# Patient Record
Sex: Female | Born: 1955 | Race: White | Hispanic: No | Marital: Single | Smoking: Current every day smoker
Health system: Southern US, Community
[De-identification: ages and names within clinical notes are randomized; demographics above are authoritative.]

## PROBLEM LIST (undated history)

## (undated) DIAGNOSIS — E890 Postprocedural hypothyroidism: Secondary | ICD-10-CM

## (undated) DIAGNOSIS — J309 Allergic rhinitis, unspecified: Secondary | ICD-10-CM

## (undated) DIAGNOSIS — F17200 Nicotine dependence, unspecified, uncomplicated: Secondary | ICD-10-CM

## (undated) DIAGNOSIS — I517 Cardiomegaly: Secondary | ICD-10-CM

## (undated) DIAGNOSIS — R739 Hyperglycemia, unspecified: Secondary | ICD-10-CM

## (undated) HISTORY — DX: Cardiomegaly: I51.7

## (undated) HISTORY — DX: Postprocedural hypothyroidism: E89.0

## (undated) HISTORY — DX: Nicotine dependence, unspecified, uncomplicated: F17.200

## (undated) HISTORY — DX: Hyperglycemia, unspecified: R73.9

## (undated) HISTORY — DX: Allergic rhinitis, unspecified: J30.9

---

## 1998-01-02 ENCOUNTER — Other Ambulatory Visit: Admission: RE | Admit: 1998-01-02 | Discharge: 1998-01-02 | Payer: Self-pay | Admitting: Gynecology

## 1999-01-14 ENCOUNTER — Other Ambulatory Visit: Admission: RE | Admit: 1999-01-14 | Discharge: 1999-01-14 | Payer: Self-pay | Admitting: Gynecology

## 1999-12-25 ENCOUNTER — Other Ambulatory Visit: Admission: RE | Admit: 1999-12-25 | Discharge: 1999-12-25 | Payer: Self-pay | Admitting: Gynecology

## 2000-01-14 ENCOUNTER — Encounter: Payer: Self-pay | Admitting: Gynecology

## 2000-01-14 ENCOUNTER — Encounter: Admission: RE | Admit: 2000-01-14 | Discharge: 2000-01-14 | Payer: Self-pay | Admitting: Gynecology

## 2001-01-14 ENCOUNTER — Encounter: Payer: Self-pay | Admitting: Gynecology

## 2001-01-14 ENCOUNTER — Encounter: Admission: RE | Admit: 2001-01-14 | Discharge: 2001-01-14 | Payer: Self-pay | Admitting: Gynecology

## 2001-01-24 ENCOUNTER — Other Ambulatory Visit: Admission: RE | Admit: 2001-01-24 | Discharge: 2001-01-24 | Payer: Self-pay | Admitting: Gynecology

## 2001-12-05 ENCOUNTER — Encounter: Payer: Self-pay | Admitting: Endocrinology

## 2001-12-05 ENCOUNTER — Ambulatory Visit (HOSPITAL_COMMUNITY): Admission: RE | Admit: 2001-12-05 | Discharge: 2001-12-05 | Payer: Self-pay | Admitting: Endocrinology

## 2002-07-22 HISTORY — PX: OTHER SURGICAL HISTORY: SHX169

## 2003-04-23 ENCOUNTER — Other Ambulatory Visit: Admission: RE | Admit: 2003-04-23 | Discharge: 2003-04-23 | Payer: Self-pay | Admitting: Gynecology

## 2003-04-26 ENCOUNTER — Encounter: Admission: RE | Admit: 2003-04-26 | Discharge: 2003-04-26 | Payer: Self-pay | Admitting: Gynecology

## 2003-05-29 ENCOUNTER — Ambulatory Visit (HOSPITAL_BASED_OUTPATIENT_CLINIC_OR_DEPARTMENT_OTHER): Admission: RE | Admit: 2003-05-29 | Discharge: 2003-05-29 | Payer: Self-pay | Admitting: Gynecology

## 2003-05-29 ENCOUNTER — Encounter (INDEPENDENT_AMBULATORY_CARE_PROVIDER_SITE_OTHER): Payer: Self-pay

## 2003-05-29 ENCOUNTER — Ambulatory Visit (HOSPITAL_COMMUNITY): Admission: RE | Admit: 2003-05-29 | Discharge: 2003-05-29 | Payer: Self-pay | Admitting: Gynecology

## 2003-10-26 HISTORY — PX: DOPPLER ECHOCARDIOGRAPHY: SHX263

## 2004-05-08 ENCOUNTER — Other Ambulatory Visit: Admission: RE | Admit: 2004-05-08 | Discharge: 2004-05-08 | Payer: Self-pay | Admitting: Gynecology

## 2004-06-13 ENCOUNTER — Encounter: Admission: RE | Admit: 2004-06-13 | Discharge: 2004-06-13 | Payer: Self-pay | Admitting: Gynecology

## 2004-11-05 ENCOUNTER — Ambulatory Visit: Payer: Self-pay | Admitting: Endocrinology

## 2004-11-11 ENCOUNTER — Ambulatory Visit: Payer: Self-pay | Admitting: Endocrinology

## 2005-01-07 ENCOUNTER — Ambulatory Visit: Payer: Self-pay | Admitting: Endocrinology

## 2005-10-09 ENCOUNTER — Ambulatory Visit: Payer: Self-pay | Admitting: Endocrinology

## 2005-10-09 HISTORY — PX: ELECTROCARDIOGRAM: SHX264

## 2005-10-28 ENCOUNTER — Encounter: Admission: RE | Admit: 2005-10-28 | Discharge: 2005-10-28 | Payer: Self-pay | Admitting: Gynecology

## 2005-10-28 ENCOUNTER — Other Ambulatory Visit: Admission: RE | Admit: 2005-10-28 | Discharge: 2005-10-28 | Payer: Self-pay | Admitting: Gynecology

## 2005-11-12 ENCOUNTER — Ambulatory Visit: Payer: Self-pay | Admitting: Endocrinology

## 2005-11-18 ENCOUNTER — Ambulatory Visit: Payer: Self-pay | Admitting: Endocrinology

## 2005-11-30 ENCOUNTER — Encounter: Admission: RE | Admit: 2005-11-30 | Discharge: 2005-11-30 | Payer: Self-pay | Admitting: Gynecology

## 2007-01-19 ENCOUNTER — Encounter: Admission: RE | Admit: 2007-01-19 | Discharge: 2007-01-19 | Payer: Self-pay | Admitting: Gynecology

## 2007-01-19 ENCOUNTER — Other Ambulatory Visit: Admission: RE | Admit: 2007-01-19 | Discharge: 2007-01-19 | Payer: Self-pay | Admitting: Gynecology

## 2007-01-21 ENCOUNTER — Encounter: Admission: RE | Admit: 2007-01-21 | Discharge: 2007-01-21 | Payer: Self-pay | Admitting: Gynecology

## 2007-01-28 ENCOUNTER — Ambulatory Visit: Payer: Self-pay | Admitting: Endocrinology

## 2007-01-28 LAB — CONVERTED CEMR LAB
ALT: 14 units/L (ref 0–35)
AST: 23 units/L (ref 0–37)
Albumin: 3.8 g/dL (ref 3.5–5.2)
Alkaline Phosphatase: 55 units/L (ref 39–117)
BUN: 15 mg/dL (ref 6–23)
Basophils Relative: 0.4 % (ref 0.0–1.0)
CO2: 30 meq/L (ref 19–32)
Chloride: 105 meq/L (ref 96–112)
Eosinophils Absolute: 0.4 10*3/uL (ref 0.0–0.6)
Eosinophils Relative: 4.2 % (ref 0.0–5.0)
GFR calc Af Amer: 98 mL/min
GFR calc non Af Amer: 81 mL/min
Glucose, Bld: 89 mg/dL (ref 70–99)
HCT: 37.6 % (ref 36.0–46.0)
HDL: 57.4 mg/dL (ref >39.0)
Hemoglobin: 13 g/dL (ref 12.0–15.0)
Lymphocytes Relative: 23.9 % (ref 12.0–46.0)
MCHC: 34.6 g/dL (ref 30.0–36.0)
Nitrite: NEGATIVE
Potassium: 5 meq/L (ref 3.5–5.1)
RDW: 13.1 % (ref 11.5–14.6)
Total Bilirubin: 0.7 mg/dL (ref 0.3–1.2)
Total CHOL/HDL Ratio: 2.5
Triglycerides: 34 mg/dL (ref 0–149)

## 2007-02-02 ENCOUNTER — Encounter: Payer: Self-pay | Admitting: Endocrinology

## 2007-02-02 DIAGNOSIS — J309 Allergic rhinitis, unspecified: Secondary | ICD-10-CM

## 2007-02-02 HISTORY — DX: Allergic rhinitis, unspecified: J30.9

## 2007-02-03 ENCOUNTER — Ambulatory Visit: Payer: Self-pay | Admitting: Endocrinology

## 2007-02-25 ENCOUNTER — Encounter: Payer: Self-pay | Admitting: Endocrinology

## 2008-01-25 ENCOUNTER — Encounter: Admission: RE | Admit: 2008-01-25 | Discharge: 2008-01-25 | Payer: Self-pay | Admitting: Gynecology

## 2008-02-24 ENCOUNTER — Ambulatory Visit: Payer: Self-pay | Admitting: Endocrinology

## 2008-02-26 LAB — CONVERTED CEMR LAB
AST: 21 units/L (ref 0–37)
Albumin: 3.7 g/dL (ref 3.5–5.2)
BUN: 18 mg/dL (ref 6–23)
Basophils Absolute: 0 10*3/uL (ref 0.0–0.1)
Bilirubin Urine: NEGATIVE
CO2: 27 meq/L (ref 19–32)
Eosinophils Absolute: 0.4 10*3/uL (ref 0.0–0.7)
GFR calc Af Amer: 114 mL/min
Leukocytes, UA: NEGATIVE
Monocytes Absolute: 0.9 10*3/uL (ref 0.1–1.0)
Neutro Abs: 4.5 10*3/uL (ref 1.4–7.7)
Nitrite: NEGATIVE
Platelets: 228 10*3/uL (ref 150–400)
Potassium: 4.6 meq/L (ref 3.5–5.1)
RBC: 4.03 M/uL (ref 3.87–5.11)
RDW: 12.8 % (ref 11.5–14.6)
Sodium: 142 meq/L (ref 135–145)
Specific Gravity, Urine: <=1.005 (ref 1.000–1.03)
Total Bilirubin: 1.1 mg/dL (ref 0.3–1.2)
Total CHOL/HDL Ratio: 2.9
Total Protein, Urine: NEGATIVE mg/dL
Total Protein: 6.9 g/dL (ref 6.0–8.3)
Triglycerides: 47 mg/dL (ref 0–149)
pH: 6 (ref 5.0–8.0)

## 2008-03-01 ENCOUNTER — Ambulatory Visit: Payer: Self-pay | Admitting: Endocrinology

## 2008-03-01 DIAGNOSIS — E890 Postprocedural hypothyroidism: Secondary | ICD-10-CM

## 2008-03-01 DIAGNOSIS — F17200 Nicotine dependence, unspecified, uncomplicated: Secondary | ICD-10-CM | POA: Insufficient documentation

## 2008-03-01 DIAGNOSIS — I517 Cardiomegaly: Secondary | ICD-10-CM

## 2008-03-01 HISTORY — DX: Nicotine dependence, unspecified, uncomplicated: F17.200

## 2008-03-01 HISTORY — DX: Postprocedural hypothyroidism: E89.0

## 2008-03-01 HISTORY — DX: Cardiomegaly: I51.7

## 2009-03-05 ENCOUNTER — Encounter: Admission: RE | Admit: 2009-03-05 | Discharge: 2009-03-05 | Payer: Self-pay | Admitting: Gynecology

## 2009-03-06 ENCOUNTER — Encounter: Payer: Self-pay | Admitting: Endocrinology

## 2009-03-13 ENCOUNTER — Ambulatory Visit: Payer: Self-pay | Admitting: Endocrinology

## 2009-03-13 LAB — CONVERTED CEMR LAB
ALT: 21 units/L (ref 0–35)
AST: 30 units/L (ref 0–37)
Albumin: 4.2 g/dL (ref 3.5–5.2)
Basophils Relative: 1.2 % (ref 0.0–3.0)
Bilirubin Urine: NEGATIVE
CO2: 30 meq/L (ref 19–32)
Cholesterol: 187 mg/dL (ref 0–200)
Creatinine, Ser: 0.8 mg/dL (ref 0.4–1.2)
Eosinophils Relative: 3.4 % (ref 0.0–5.0)
GFR calc non Af Amer: 80.45 mL/min (ref >60)
Glucose, Bld: 94 mg/dL (ref 70–99)
HDL: 60.7 mg/dL (ref >39.00)
LDL Cholesterol: 114 mg/dL — ABNORMAL HIGH (ref 0–99)
Leukocytes, UA: NEGATIVE
Lymphocytes Relative: 24.3 % (ref 12.0–46.0)
Lymphs Abs: 2.1 10*3/uL (ref 0.7–4.0)
Monocytes Relative: 9.9 % (ref 3.0–12.0)
Neutro Abs: 5.2 10*3/uL (ref 1.4–7.7)
Neutrophils Relative %: 61.2 % (ref 43.0–77.0)
RBC: 4.73 M/uL (ref 3.87–5.11)
RDW: 13.4 % (ref 11.5–14.6)
Sodium: 143 meq/L (ref 135–145)
Total CHOL/HDL Ratio: 3
Total Protein, Urine: NEGATIVE mg/dL
Urine Glucose: NEGATIVE mg/dL
WBC: 8.5 10*3/uL (ref 4.5–10.5)

## 2009-03-19 ENCOUNTER — Ambulatory Visit: Payer: Self-pay | Admitting: Endocrinology

## 2009-04-11 ENCOUNTER — Ambulatory Visit: Payer: Self-pay | Admitting: Gastroenterology

## 2009-04-22 ENCOUNTER — Encounter: Payer: Self-pay | Admitting: Gastroenterology

## 2009-04-22 ENCOUNTER — Ambulatory Visit: Payer: Self-pay | Admitting: Gastroenterology

## 2009-04-22 LAB — HM COLONOSCOPY

## 2009-04-24 ENCOUNTER — Encounter: Payer: Self-pay | Admitting: Gastroenterology

## 2009-05-10 ENCOUNTER — Ambulatory Visit: Payer: Self-pay | Admitting: Endocrinology

## 2009-05-11 LAB — CONVERTED CEMR LAB
LDL Cholesterol: 86 mg/dL (ref 0–99)
TSH: 1.15 microintl units/mL (ref 0.35–5.50)
Total CHOL/HDL Ratio: 3
VLDL: 15.2 mg/dL (ref 0.0–40.0)

## 2009-09-30 ENCOUNTER — Telehealth: Payer: Self-pay | Admitting: Endocrinology

## 2010-04-15 ENCOUNTER — Ambulatory Visit: Payer: Self-pay | Admitting: Endocrinology

## 2010-04-15 LAB — CONVERTED CEMR LAB
Albumin: 4.1 g/dL (ref 3.5–5.2)
Basophils Absolute: 0.3 10*3/uL — ABNORMAL HIGH (ref 0.0–0.1)
Basophils Relative: 3.6 % — ABNORMAL HIGH (ref 0.0–3.0)
CO2: 28 meq/L (ref 19–32)
Cholesterol: 163 mg/dL (ref 0–200)
Creatinine, Ser: 0.8 mg/dL (ref 0.4–1.2)
Eosinophils Relative: 3.8 % (ref 0.0–5.0)
Glucose, Bld: 98 mg/dL (ref 70–99)
HDL: 63.1 mg/dL (ref >39.00)
Hemoglobin, Urine: NEGATIVE
Lymphocytes Relative: 25.7 % (ref 12.0–46.0)
MCV: 91.8 fL (ref 78.0–100.0)
Monocytes Absolute: 0.6 10*3/uL (ref 0.1–1.0)
Monocytes Relative: 8 % (ref 3.0–12.0)
Neutro Abs: 4.4 10*3/uL (ref 1.4–7.7)
Neutrophils Relative %: 58.9 % (ref 43.0–77.0)
Nitrite: NEGATIVE
Potassium: 5.4 meq/L — ABNORMAL HIGH (ref 3.5–5.1)
RBC: 4.01 M/uL (ref 3.87–5.11)
Sodium: 139 meq/L (ref 135–145)
Specific Gravity, Urine: <=1.005 (ref 1.000–1.030)
WBC: 7.5 10*3/uL (ref 4.5–10.5)

## 2010-04-22 ENCOUNTER — Encounter: Admission: RE | Admit: 2010-04-22 | Discharge: 2010-04-22 | Payer: Self-pay | Admitting: Gynecology

## 2010-04-23 ENCOUNTER — Ambulatory Visit: Payer: Self-pay | Admitting: Endocrinology

## 2010-04-23 ENCOUNTER — Encounter: Payer: Self-pay | Admitting: Endocrinology

## 2010-07-29 NOTE — Assessment & Plan Note (Signed)
Summary: PHYSICAL---STC   Vital Signs:  Patient profile:   55 year old female Height:      67 inches Weight:      174 pounds (79.09 kg) BMI:     27.35 O2 Sat:      98 % on Room air Temp:     98.1 degrees F (36.72 degrees C) oral Pulse rate:   75 / minute BP sitting:   126 / 76  (left arm) Cuff size:   regular  Vitals Entered By: Brenton Grills MA (April 23, 2010 8:59 AM)  O2 Flow:  Room air CC: Physical/aj Is Patient Diabetic? No Comments Pt had mammogram and pap smear on 04/22/2010    CC:  Physical/aj.  History of Present Illness: here for regular wellness examination.  she's feeling pretty well in general, and seldom drinks etoh.   Current Medications (verified): 1)  Multivitamins   Tabs (Multiple Vitamin) .... Take 1 By Mouth Qd 2)  Calcium 600 1500 Mg  Tabs (Calcium Carbonate) .... T5ake 1 By Mouth Qd 3)  Lisinopril 10 Mg  Tabs (Lisinopril) .... Take 1 Po Qd 4)  Mevacor 40 Mg  Tabs (Lovastatin) .... Take 1 By Mouth Qd 5)  Levothyroxine Sodium 150 Mcg Tabs (Levothyroxine Sodium) .Marland Kitchen.. 1 Qd  Allergies (verified): No Known Drug Allergies  Past History:  Past Medical History: Last updated: 02/02/2007 Allergic rhinitis Hypothyroidism Hyperglycemia Smoker  Family History: Reviewed history from 03/19/2009 and no changes required. father had lung cancer.  Social History: Reviewed history from 03/19/2009 and no changes required. works Engineering geologist. single.  Review of Systems  The patient denies fever, weight loss, weight gain, vision loss, decreased hearing, chest pain, syncope, dyspnea on exertion, prolonged cough, headaches, abdominal pain, melena, hematochezia, severe indigestion/heartburn, hematuria, suspicious skin lesions, and depression.    Physical Exam  General:  normal appearance.   Head:  head: no deformity eyes: no periorbital swelling, no proptosis external nose and ears are normal mouth: no lesion seen Neck:  Supple without thyroid enlargement  or tenderness.  Breasts:  sees gyn Lungs:  Clear to auscultation bilaterally. Normal respiratory effort.  Heart:  Regular rate and rhythm without murmurs or gallops noted. Normal S1,S2.   Abdomen:  abdomen is soft, nontender.  no hepatosplenomegaly.   not distended.  no hernia  Rectal:  sees gyn  Genitalia:  sees gyn  Msk:  muscle bulk and strength are grossly normal.  no obvious joint swelling.  gait is normal and steady  Pulses:  dorsalis pedis intact bilat.  no carotid bruit  Extremities:  no deformity.  no ulcer on the feet.  feet are of normal color and temp.  no edema  Neurologic:  cn 2-12 grossly intact.   readily moves all 4's.   sensation is intact to touch on the feet  Skin:  normal texture and temp.  no rash.  not diaphoretic  Cervical Nodes:  No significant adenopathy.  Psych:  Alert and cooperative; normal mood and affect; normal attention span and concentration.     Impression & Recommendations:  Problem # 1:  ROUTINE GENERAL MEDICAL EXAM@HEALTH  CARE FACL (ICD-V70.0) we discussed welbutrin and chantix.  she chooses welbutrin  Medications Added to Medication List This Visit: 1)  Multivitamins Tabs (Multiple vitamin) .... Take 1 by mouth once daily 2)  Calcium 600 1500 Mg Tabs (Calcium carbonate) .... Take 1 by mouth once daily 3)  Lisinopril 10 Mg Tabs (Lisinopril) .... Take 1 po once daily 4)  Mevacor 40  Mg Tabs (Lovastatin) .... Take 1 by mouth once daily 5)  Levothyroxine Sodium 150 Mcg Tabs (Levothyroxine sodium) .Marland Kitchen.. 1 once daily 6)  Bupropion Hcl 150 Mg Xr12h-tab (Bupropion hcl) .Marland Kitchen.. 1 tab each am  Other Orders: Admin 1st Vaccine (52841) Flu Vaccine 53yrs + (32440) Est. Patient 40-64 years [phone])  Preventive Care Screening     gyn is dr Nicholas Lose, who does dexa and mammography   Patient Instructions: 1)  please consider these measures for your health:  minimize alcohol.  do not use tobacco products.  have a colonoscopy at least every 10 years from age  73.  keep firearms safely stored.  always use seat belts.  have working smoke alarms in your home.  see an eye doctor and dentist regularly.  never drive under the influence of alcohol or drugs (including prescription drugs).  those with fair skin should take precautions against the sun. 2)  please let me know what your wishes would be, if artificial life support measures should become necessary.  it is critically important to prevent falling down (keep floor areas well-lit, dry, and free of loose objects) 3)  take buproprion 150 mg each am.  call if you want to increase to 300 mg each am, to help you quit smoking. 4)  (update:  we discussed code status.  pt requests full code, but would not want to be started or maintained on artificial life-support measures if there was not a reasonable chance of recovery) Prescriptions: LEVOTHYROXINE SODIUM 150 MCG TABS (LEVOTHYROXINE SODIUM) 1 once daily  #30 x 11   Entered by:   Brenton Grills CMA (AAMA)   Authorized by:   Minus Breeding MD   Signed by:   Brenton Grills CMA (AAMA) on 04/23/2010   Method used:   [facility] to        [facility]  [address] [id number]       [address]       [place], [place]  [address]       Ph: [phone] or [phone]       Fax: [phone]   RxID:   [id number] MEVACOR 40 MG  TABS (LOVASTATIN) take 1 by mouth once daily  #30 x 11   Entered by:   Brenton Grills CMA (AAMA)   Authorized by:   Minus Breeding MD   Signed by:   Brenton Grills CMA (AAMA) on 04/23/2010   Method used:   [facility]        [facility]  [address] [id number] ([facility]       [address]       [place], [place]  [address]       Ph: [phone] or [phone]       Fax: [phone]   RxID:   [id number] LISINOPRIL 10 MG  TABS (LISINOPRIL) take 1 po once daily  #30 x 11   Entered by:   Brenton Grills CMA (AAMA)   Authorized by:   Minus Breeding MD   Signed by:   Brenton Grills CMA (AAMA) on  04/23/2010   Method used:   [facility]        [facility]  [address] [id number]       [address]       [place], [place]  [address]       Ph: [phone] or [phone]  Fax: [phone]   RxID:   [id number] BUPROPION HCL 150 MG XR12H-TAB (BUPROPION HCL) 1 tab each am  #30 x 11   Entered and Authorized by:   Minus Breeding MD   Signed by:   Minus Breeding MD on 04/23/2010   Method used:   [facility]        [facility]  [address] [id number]       [address]       [place], [place]  [address]       Ph: [phone] or [phone]       Fax: [phone]   RxID:   [id number]    Orders Added: 1)  Admin 1st Vaccine [90471] 2)  Flu Vaccine 50yrs + [66063] 3)  Est. Patient 40-64 years [01601]    Flu Vaccine Consent Questions     Do you have a history of severe allergic reactions to this vaccine? no    Any prior history of allergic reactions to egg and/or gelatin? no    Do you have a sensitivity to the preservative Thimersol? no    Do you have a past history of Guillan-Barre Syndrome? no    Do you currently have an acute febrile illness? no    Have you ever had a severe reaction to latex? no    Vaccine information given and explained to patient? yes    Are you currently pregnant? no    Lot Number:[id number]   Exp Date:12/27/2010   Site Given  Left Deltoid IM  Preventive Care Screening     gyn is dr lomax, who does dexa and mammography       .lbflu1

## 2010-07-29 NOTE — Progress Notes (Signed)
Summary: Levothyroxine  Phone Note From Pharmacy   Caller[facility]  [address] [id number] Reason for Call: Allergy Alert Details of Request: [facility] [phone] ID:  [id number] Summary of Call: The office rec'd a note from pharmacy that Levothyroxine requires prior authorization.  [facility] [employer] noted above is patient workers comp, I made them aware that they probably should not be dealing with this med and they stated that they would contact the pharmacy directly. No further action needed. Initial call taken by: Lucious Groves,  September 30, 2009 11:17 AM

## 2010-11-14 NOTE — Op Note (Signed)
NAME:  Victoria Landry, Victoria Landry                      ACCOUNT NO.:  [id number]   MEDICAL RECORD NO.:  [id number]                   PATIENT TYPE:  AMB   LOCATION:  [facility]                                 FACILITY:  [facility]   PHYSICIAN:  Gretta Cool, M.D.              DATE OF BIRTH:  July 01, 1955   DATE OF PROCEDURE:  05/29/2003  DATE OF DISCHARGE:                                 OPERATIVE REPORT   PREOPERATIVE DIAGNOSIS:  Endometrial polyp.   POSTOPERATIVE DIAGNOSIS:  Endometrial polyp.   PROCEDURE:  Hysteroscopy resection of endometrial polyp and total  endometrial resection for ablation.   SURGEON:  Gretta Cool, M.D.   ANESTHESIA:  MAC plus paracervical block.   DESCRIPTION OF PROCEDURE:  The patient had previously had laminaria  insertion in the office on May 28, 2003, the laminaria was removed, and  the cervix was already dilated sufficient to accommodate the resectoscope.  The resectoscope was then introduced and because of the small size of the  cavity and presence of the polyp there was difficult exchange of fluid.  Once a portion of the polyp was resected the instrument could be placed  further into the cavity to allow fluid exchange.  Cavity was small enough  that only inflow could occur and outflow was restricted.  Once the cavity  was adequately resected of polyp the cavity was sufficient in size to allow  return.  A small channel was made in the internal os to allow some return as  well.  Once the entire cavity was resected of polyp the entire cavity was  then resected of endometrium.  The VaporTrode was then used to  systematically treat the entire cavity to eliminate any viable islands of  endometrial tissue.  The cornual areas were treated by touch technique.  At  the end of the procedure the fluid deficit was less than 100 mL.  There were  no complications.  The bleeding was well controlled at reduced pressure.  At  this point the procedure was terminated without  complication and the patient  returned to the recovery room in excellent condition.                                               Gretta Cool, M.D.    CWL/MEDQ  D:  05/29/2003  T:  05/29/2003  Job:  [id number]   cc:   Gregary Signs A. Everardo All, M.D. [facility]

## 2011-03-30 LAB — HM PAP SMEAR: HM Pap smear: NORMAL

## 2011-04-20 ENCOUNTER — Other Ambulatory Visit: Payer: Self-pay | Admitting: Gynecology

## 2011-04-20 DIAGNOSIS — Z1231 Encounter for screening mammogram for malignant neoplasm of breast: Secondary | ICD-10-CM

## 2011-04-28 ENCOUNTER — Other Ambulatory Visit: Payer: Self-pay | Admitting: Gynecology

## 2011-05-04 ENCOUNTER — Ambulatory Visit: Payer: Self-pay

## 2011-05-13 ENCOUNTER — Ambulatory Visit
Admission: RE | Admit: 2011-05-13 | Discharge: 2011-05-13 | Disposition: A | Discharge status: Home / Self Care | Payer: BC Managed Care – PPO | Source: Ambulatory Visit | Attending: Gynecology | Admitting: Gynecology

## 2011-05-13 DIAGNOSIS — Z1231 Encounter for screening mammogram for malignant neoplasm of breast: Secondary | ICD-10-CM

## 2011-05-19 ENCOUNTER — Other Ambulatory Visit: Payer: Self-pay | Admitting: Endocrinology

## 2011-05-22 ENCOUNTER — Other Ambulatory Visit: Payer: Self-pay | Admitting: Gynecology

## 2011-05-22 DIAGNOSIS — R928 Other abnormal and inconclusive findings on diagnostic imaging of breast: Secondary | ICD-10-CM

## 2011-06-12 ENCOUNTER — Ambulatory Visit
Admission: RE | Admit: 2011-06-12 | Discharge: 2011-06-12 | Disposition: A | Discharge status: Home / Self Care | Payer: BC Managed Care – PPO | Source: Ambulatory Visit | Attending: Gynecology | Admitting: Gynecology

## 2011-06-12 DIAGNOSIS — R928 Other abnormal and inconclusive findings on diagnostic imaging of breast: Secondary | ICD-10-CM

## 2011-06-26 ENCOUNTER — Other Ambulatory Visit: Payer: Self-pay | Admitting: Endocrinology

## 2011-07-20 ENCOUNTER — Other Ambulatory Visit: Payer: Self-pay | Admitting: Endocrinology

## 2011-07-27 ENCOUNTER — Other Ambulatory Visit: Payer: Self-pay | Admitting: Endocrinology

## 2011-08-07 ENCOUNTER — Telehealth: Payer: Self-pay | Admitting: *Deleted

## 2011-08-07 DIAGNOSIS — Z0000 Encounter for general adult medical examination without abnormal findings: Secondary | ICD-10-CM

## 2011-08-07 NOTE — Telephone Encounter (Signed)
Labs placed into Epic for upcoming CPX appointment.  

## 2011-08-07 NOTE — Telephone Encounter (Signed)
Message copied by Carin Primrose on Fri Aug 07, 2011  8:38 AM ------      Message from: Earl Lagos      Created: Tue Aug 04, 2011 11:56 AM      Regarding: labs       Please schedule labs for cpx 08/13/11.

## 2011-08-10 ENCOUNTER — Other Ambulatory Visit (INDEPENDENT_AMBULATORY_CARE_PROVIDER_SITE_OTHER): Payer: BC Managed Care – PPO

## 2011-08-10 DIAGNOSIS — Z0000 Encounter for general adult medical examination without abnormal findings: Secondary | ICD-10-CM

## 2011-08-10 LAB — CBC WITH DIFFERENTIAL/PLATELET
Basophils Relative: 0.6 % (ref 0.0–3.0)
Eosinophils Relative: 3.7 % (ref 0.0–5.0)
Lymphocytes Relative: 26.5 % (ref 12.0–46.0)
Monocytes Relative: 10.8 % (ref 3.0–12.0)
Neutrophils Relative %: 58.4 % (ref 43.0–77.0)
Platelets: 239 10*3/uL (ref 150.0–400.0)
RBC: 4.29 Mil/uL (ref 3.87–5.11)
WBC: 7.4 10*3/uL (ref 4.5–10.5)

## 2011-08-10 LAB — BASIC METABOLIC PANEL
CO2: 28 mEq/L (ref 19–32)
Chloride: 103 mEq/L (ref 96–112)
GFR: 67.82 mL/min (ref >60.00)
Glucose, Bld: 93 mg/dL (ref 70–99)
Potassium: 5.2 mEq/L — ABNORMAL HIGH (ref 3.5–5.1)
Sodium: 137 mEq/L (ref 135–145)

## 2011-08-10 LAB — URINALYSIS, ROUTINE W REFLEX MICROSCOPIC
Bilirubin Urine: NEGATIVE
Leukocytes, UA: NEGATIVE
Nitrite: NEGATIVE
pH: 7 (ref 5.0–8.0)

## 2011-08-10 LAB — HEPATIC FUNCTION PANEL
AST: 25 U/L (ref 0–37)
Albumin: 4.1 g/dL (ref 3.5–5.2)

## 2011-08-10 LAB — LIPID PANEL
Total CHOL/HDL Ratio: 3
Triglycerides: 66 mg/dL (ref 0.0–149.0)

## 2011-08-10 LAB — TSH: TSH: 7.54 u[IU]/mL — ABNORMAL HIGH (ref 0.35–5.50)

## 2011-08-13 ENCOUNTER — Ambulatory Visit (INDEPENDENT_AMBULATORY_CARE_PROVIDER_SITE_OTHER): Payer: BC Managed Care – PPO | Admitting: Endocrinology

## 2011-08-13 ENCOUNTER — Encounter: Payer: Self-pay | Admitting: Endocrinology

## 2011-08-13 VITALS — BP 128/82 | HR 90 | Temp 97.0°F | Ht 66.0 in | Wt 182.6 lb

## 2011-08-13 DIAGNOSIS — Z0000 Encounter for general adult medical examination without abnormal findings: Secondary | ICD-10-CM

## 2011-08-13 DIAGNOSIS — E890 Postprocedural hypothyroidism: Secondary | ICD-10-CM

## 2011-08-13 MED ORDER — VARENICLINE TARTRATE 0.5 MG PO TABS: 0.5000 mg | ORAL_TABLET | Freq: Two times a day (BID) | ORAL | Status: AC

## 2011-08-13 MED ORDER — LEVOTHYROXINE SODIUM 175 MCG PO TABS: 175.0000 ug | ORAL_TABLET | Freq: Every day | ORAL | Status: DC

## 2011-08-13 MED ORDER — LISINOPRIL-HYDROCHLOROTHIAZIDE 10-12.5 MG PO TABS: ORAL_TABLET | ORAL | Status: DC

## 2011-08-13 NOTE — Patient Instructions (Addendum)
Increase levothyroxine to 175 mcg/day Change lisinopril to lisinopril/hctz, 10/12.5, 1/2 pill daily. i have sent a prescription to your pharmacy, for "chantix" (quit-smoking medication) Put "miconazole," or clotrimazole" cream on your feet 2x a day. please consider these measures for your health:  minimize alcohol.  do not use tobacco products.  have a colonoscopy at least every 10 years from age 54.  Women should have an annual mammogram from age 12.  keep firearms safely stored.  always use seat belts.  have working smoke alarms in your home.  see an eye doctor and dentist regularly.  never drive under the influence of alcohol or drugs (including prescription drugs).  those with fair skin should take precautions against the sun. Please return in 1 year.

## 2011-08-13 NOTE — Progress Notes (Signed)
Subjective:    Patient ID: Victoria Landry, female    DOB: [birth year], 56 y.o.   MRN: [id number]  HPI here for regular wellness examination.  He's feeling pretty well in general, and says chronic med probs are stable, except as noted below.  She smokes 1/2 ppd, and does not take the wellbutrin. Gyn is dr lomax. Past Medical History  Diagnosis Date  . ALLERGIC RHINITIS 02/02/2007  . HYPOTHYROIDISM, POST-RADIATION 03/01/2008  . SMOKER 03/01/2008  . VENTRICULAR HYPERTROPHY, LEFT 03/01/2008  . Hyperglycemia     Past Surgical History  Procedure Date  . Doppler echocardiography 10/26/2003  . Electrocardiogram 10/09/2005  . I-131 therapy 07/22/2002    History   Social History  . Marital Status: Single    Spouse Name: N/A    Number of Children: N/A  . Years of Education: N/A   Occupational History  . Works Nurse, children[employer]    Social History Main Topics  . Smoking status: Current Everyday Smoker  . Smokeless tobacco: Not on file  . Alcohol Use: Not on file  . Drug Use: Not on file  . Sexually Active: Not on file   Other Topics Concern  . Not on file   Social History Narrative  . No narrative on file    Current Outpatient Prescriptions on File Prior to Visit  Medication Sig Dispense Refill  . buPROPion (WELLBUTRIN SR) 150 MG 12 hr tablet TAKE 1 TABLET BY MOUTH EVERY MORNING  30 tablet  0  . calcium carbonate (OS-CAL) 600 MG TABS Take 600 mg by mouth daily.      Marland Kitchen lovastatin (MEVACOR) 40 MG tablet TAKE 1 TABLET BY MOUTH EVERY DAY  30 tablet  0  . Multiple Vitamin (MULTIVITAMIN) tablet Take 1 tablet by mouth daily.        No Known Allergies  Family History  Problem Relation Age of Onset  . Cancer Father     Lung Cancer    BP 128/82  Pulse 90  Temp(Src) 97 F (36.1 C) (Oral)  Ht 5\' 6"  (1.676 m)  Wt 182 lb 9.6 oz (82.827 kg)  BMI 29.47 kg/m2  SpO2 98%    Review of Systems  Constitutional: Negative for fever.  HENT: Negative for hearing loss.   Eyes: Negative for visual  disturbance.  Respiratory: Negative for shortness of breath.   Cardiovascular: Negative for chest pain.  Gastrointestinal: Negative for anal bleeding.  Genitourinary: Negative for hematuria.  Musculoskeletal: Negative for back pain.  Skin: Negative for rash.  Neurological: Negative for syncope.  Hematological: Does not bruise/bleed easily.  Psychiatric/Behavioral: Negative for dysphoric mood.       Objective:   Physical Exam VS: see vs page GEN: no distress HEAD: head: no deformity eyes: no periorbital swelling, no proptosis external nose and ears are normal mouth: no lesion seen NECK: supple, thyroid is not enlarged CHEST WALL: no deformity LUNGS:  Clear to auscultation BREASTS:  sees gyn CV: reg rate and rhythm, no murmur ABD: abdomen is soft, nontender.  no hepatosplenomegaly.  not distended.  no hernia GENITALIA/RECTAL: sees gyn MUSCULOSKELETAL: muscle bulk and strength are grossly normal.  no obvious joint swelling.  gait is normal and steady EXTEMITIES: no deformity.  no ulcer on the feet.  feet are of normal color and temp.  no edema PULSES: dorsalis pedis intact bilat.  no carotid bruit NEURO:  cn 2-12 grossly intact.   readily moves all 4's.  sensation is intact to touch on the feet SKIN:  Normal  texture and temperature.  No rash or suspicious lesion is visible.   NODES:  None palpable at the neck PSYCH: alert, oriented x3.  Does not appear anxious nor depressed.        Assessment & Plan:  Wellness visit today, with problems stable, except as noted.    SEPARATE EVALUATION FOLLOWS--EACH PROBLEM HERE IS NEW, NOT RESPONDING TO TREATMENT, OR POSES SIGNIFICANT RISK TO THE PATIENT'S HEALTH: HISTORY OF THE PRESENT ILLNESS: Pt states few mos of moderate rash on the feet, and assoc itching PAST MEDICAL HISTORY reviewed and up to date today REVIEW OF SYSTEMS: Denies numbness adn weight change PHYSICAL EXAMINATION: VITAL SIGNS:  See vs page GENERAL: no  distress Pulses: dorsalis pedis intact bilat.   Feet: no deformity.  no ulcer on the feet.  feet are of normal color and temp.  no edema.  On the soles of the feet, there is mild to moderate tinea pedis Neuro: sensation is intact to touch on the feet LAB/XRAY RESULTS: Lab Results  Component Value Date   WBC 7.4 08/10/2011   HGB 13.9 08/10/2011   HCT 39.4 08/10/2011   PLT 239.0 08/10/2011   GLUCOSE 93 08/10/2011   CHOL 168 08/10/2011   TRIG 66.0 08/10/2011   HDL 65.80 08/10/2011   LDLCALC 89 08/10/2011   ALT 16 08/10/2011   AST 25 08/10/2011   NA 137 08/10/2011   K 5.2* 08/10/2011   CL 103 08/10/2011   CREATININE 0.9 08/10/2011   BUN 18 08/10/2011   CO2 28 08/10/2011   TSH 7.54* 08/10/2011   IMPRESSION: Mild hyperkalemia Hypothyroidism, needs increased rx Tinea pedis, new PLAN: See instruction page

## 2011-09-21 ENCOUNTER — Other Ambulatory Visit: Payer: Self-pay | Admitting: Endocrinology

## 2011-09-26 ENCOUNTER — Other Ambulatory Visit: Payer: Self-pay | Admitting: Endocrinology

## 2012-08-01 ENCOUNTER — Other Ambulatory Visit: Payer: Self-pay | Admitting: Gynecology

## 2012-08-01 DIAGNOSIS — Z1231 Encounter for screening mammogram for malignant neoplasm of breast: Secondary | ICD-10-CM

## 2012-08-09 ENCOUNTER — Ambulatory Visit: Payer: BC Managed Care – PPO

## 2012-08-24 ENCOUNTER — Other Ambulatory Visit: Payer: Self-pay | Admitting: Gynecology

## 2012-09-07 ENCOUNTER — Ambulatory Visit
Admission: RE | Admit: 2012-09-07 | Discharge: 2012-09-07 | Disposition: A | Discharge status: Home / Self Care | Payer: BC Managed Care – PPO | Source: Ambulatory Visit | Attending: Gynecology | Admitting: Gynecology

## 2012-09-07 DIAGNOSIS — Z1231 Encounter for screening mammogram for malignant neoplasm of breast: Secondary | ICD-10-CM

## 2013-09-20 ENCOUNTER — Other Ambulatory Visit: Payer: Self-pay

## 2013-09-20 DIAGNOSIS — Z1231 Encounter for screening mammogram for malignant neoplasm of breast: Secondary | ICD-10-CM

## 2013-10-02 ENCOUNTER — Ambulatory Visit
Admission: RE | Admit: 2013-10-02 | Discharge: 2013-10-02 | Disposition: A | Discharge status: Home / Self Care | Payer: BC Managed Care – PPO | Source: Ambulatory Visit

## 2013-10-02 DIAGNOSIS — Z1231 Encounter for screening mammogram for malignant neoplasm of breast: Secondary | ICD-10-CM

## 2013-10-11 ENCOUNTER — Encounter: Payer: Self-pay | Admitting: Endocrinology

## 2013-10-11 ENCOUNTER — Ambulatory Visit (INDEPENDENT_AMBULATORY_CARE_PROVIDER_SITE_OTHER): Payer: BC Managed Care – PPO | Admitting: Endocrinology

## 2013-10-11 ENCOUNTER — Ambulatory Visit
Admission: RE | Admit: 2013-10-11 | Discharge: 2013-10-11 | Disposition: A | Discharge status: Home / Self Care | Payer: BC Managed Care – PPO | Source: Ambulatory Visit | Attending: Endocrinology | Admitting: Endocrinology

## 2013-10-11 VITALS — BP 122/78 | HR 84 | Temp 97.9°F | Ht 66.0 in | Wt 195.0 lb

## 2013-10-11 DIAGNOSIS — R0689 Other abnormalities of breathing: Secondary | ICD-10-CM

## 2013-10-11 DIAGNOSIS — R9431 Abnormal electrocardiogram [ECG] [EKG]: Secondary | ICD-10-CM

## 2013-10-11 DIAGNOSIS — R0989 Other specified symptoms and signs involving the circulatory and respiratory systems: Secondary | ICD-10-CM

## 2013-10-11 DIAGNOSIS — R918 Other nonspecific abnormal finding of lung field: Secondary | ICD-10-CM

## 2013-10-11 DIAGNOSIS — Z0000 Encounter for general adult medical examination without abnormal findings: Secondary | ICD-10-CM

## 2013-10-11 DIAGNOSIS — F17200 Nicotine dependence, unspecified, uncomplicated: Secondary | ICD-10-CM

## 2013-10-11 LAB — CBC WITH DIFFERENTIAL/PLATELET
Basophils Absolute: 0.1 10*3/uL (ref 0.0–0.1)
Basophils Relative: 0.8 % (ref 0.0–3.0)
EOS PCT: 2.5 % (ref 0.0–5.0)
Eosinophils Absolute: 0.2 10*3/uL (ref 0.0–0.7)
HEMATOCRIT: 37.2 % (ref 36.0–46.0)
HEMOGLOBIN: 12.6 g/dL (ref 12.0–15.0)
LYMPHS ABS: 1.4 10*3/uL (ref 0.7–4.0)
Lymphocytes Relative: 19.3 % (ref 12.0–46.0)
MCHC: 33.8 g/dL (ref 30.0–36.0)
MCV: 94.2 fl (ref 78.0–100.0)
MONO ABS: 0.7 10*3/uL (ref 0.1–1.0)
MONOS PCT: 9.1 % (ref 3.0–12.0)
NEUTROS ABS: 5.1 10*3/uL (ref 1.4–7.7)
Neutrophils Relative %: 68.3 % (ref 43.0–77.0)
PLATELETS: 251 10*3/uL (ref 150.0–400.0)
RBC: 3.95 Mil/uL (ref 3.87–5.11)
RDW: 15.8 % — ABNORMAL HIGH (ref 11.5–14.6)
WBC: 7.4 10*3/uL (ref 4.5–10.5)

## 2013-10-11 LAB — HEPATIC FUNCTION PANEL
ALBUMIN: 4.3 g/dL (ref 3.5–5.2)
ALT: 160 U/L — ABNORMAL HIGH (ref 0–35)
AST: 187 U/L — AB (ref 0–37)
Alkaline Phosphatase: 51 U/L (ref 39–117)
BILIRUBIN TOTAL: 1 mg/dL (ref 0.3–1.2)
Bilirubin, Direct: 0 mg/dL (ref 0.0–0.3)
Total Protein: 8.1 g/dL (ref 6.0–8.3)

## 2013-10-11 LAB — URINALYSIS, ROUTINE W REFLEX MICROSCOPIC
Bilirubin Urine: NEGATIVE
Hgb urine dipstick: NEGATIVE
Ketones, ur: NEGATIVE
Leukocytes, UA: NEGATIVE
Nitrite: NEGATIVE
RBC / HPF: NONE SEEN (ref 0–?)
Specific Gravity, Urine: <=1.005 — AB (ref 1.000–1.030)
TOTAL PROTEIN, URINE-UPE24: NEGATIVE
URINE GLUCOSE: NEGATIVE
UROBILINOGEN UA: 0.2 (ref 0.0–1.0)
WBC UA: NONE SEEN (ref 0–?)
pH: 6.5 (ref 5.0–8.0)

## 2013-10-11 LAB — LIPID PANEL
CHOL/HDL RATIO: 4
Cholesterol: 312 mg/dL — ABNORMAL HIGH (ref 0–200)
HDL: 69.5 mg/dL (ref >39.00)
LDL Cholesterol: 226 mg/dL — ABNORMAL HIGH (ref 0–99)
TRIGLYCERIDES: 82 mg/dL (ref 0.0–149.0)
VLDL: 16.4 mg/dL (ref 0.0–40.0)

## 2013-10-11 LAB — BASIC METABOLIC PANEL
BUN: 25 mg/dL — ABNORMAL HIGH (ref 6–23)
CHLORIDE: 104 meq/L (ref 96–112)
CO2: 26 meq/L (ref 19–32)
Calcium: 9.6 mg/dL (ref 8.4–10.5)
Creatinine, Ser: 1 mg/dL (ref 0.4–1.2)
GFR: 63.29 mL/min (ref >60.00)
Glucose, Bld: 85 mg/dL (ref 70–99)
Potassium: 3.9 mEq/L (ref 3.5–5.1)
SODIUM: 138 meq/L (ref 135–145)

## 2013-10-11 LAB — TSH: TSH: 71.91 u[IU]/mL — ABNORMAL HIGH (ref 0.35–5.50)

## 2013-10-11 MED ORDER — LEVOTHYROXINE SODIUM 200 MCG PO TABS: 200.0000 ug | ORAL_TABLET | Freq: Every day | ORAL | Status: DC

## 2013-10-11 MED ORDER — TRIAMCINOLONE ACETONIDE 0.1 % EX CREA: 1.0000 "application " | TOPICAL_CREAM | Freq: Three times a day (TID) | CUTANEOUS | Status: DC

## 2013-10-11 NOTE — Patient Instructions (Addendum)
Let's check and "echo" (an easy and painless heart test).  you will receive a phone call, about a day and time for an appointment. i have sent a prescription to your pharmacy, for the rash.   please consider these measures for your health:  minimize alcohol.  do not use tobacco products.  have a colonoscopy at least every 10 years from age 56.  Women should have an annual mammogram from age 21.  keep firearms safely stored.  always use seat belts.  have working smoke alarms in your home.  see an eye doctor and dentist regularly.  never drive under the influence of alcohol or drugs (including prescription drugs).  those with fair skin should take precautions against the sun.   blood tests, and a chest-x-ray, are being requested for you today.  We'll contact you with results.      Smoking Cessation Quitting smoking is important to your health and has many advantages. However, it is not always easy to quit since nicotine is a very addictive drug. Often times, people try 3 times or more before being able to quit. This document explains the best ways for you to prepare to quit smoking. Quitting takes hard work and a lot of effort, but you can do it. ADVANTAGES OF QUITTING SMOKING  You will live longer, feel better, and live better.  Your body will feel the impact of quitting smoking almost immediately.  Within 20 minutes, blood pressure decreases. Your pulse returns to its normal level.  After 8 hours, carbon monoxide levels in the blood return to normal. Your oxygen level increases.  After 24 hours, the chance of having a heart attack starts to decrease. Your breath, hair, and body stop smelling like smoke.  After 48 hours, damaged nerve endings begin to recover. Your sense of taste and smell improve.  After 72 hours, the body is virtually free of nicotine. Your bronchial tubes relax and breathing becomes easier.  After 2 to 12 weeks, lungs can hold more air. Exercise becomes easier and  circulation improves.  The risk of having a heart attack, stroke, cancer, or lung disease is greatly reduced.  After 1 year, the risk of coronary heart disease is cut in half.  After 5 years, the risk of stroke falls to the same as a nonsmoker.  After 10 years, the risk of lung cancer is cut in half and the risk of other cancers decreases significantly.  After 15 years, the risk of coronary heart disease drops, usually to the level of a nonsmoker.  If you are pregnant, quitting smoking will improve your chances of having a healthy baby.  The people you live with, especially any children, will be healthier.  You will have extra money to spend on things other than cigarettes. QUESTIONS TO THINK ABOUT BEFORE ATTEMPTING TO QUIT You may want to talk about your answers with your caregiver.  Why do you want to quit?  If you tried to quit in the past, what helped and what did not?  What will be the most difficult situations for you after you quit? How will you plan to handle them?  Who can help you through the tough times? Your family? Friends? A caregiver?  What pleasures do you get from smoking? What ways can you still get pleasure if you quit? Here are some questions to ask your caregiver:  How can you help me to be successful at quitting?  What medicine do you think would be best for me  and how should I take it?  What should I do if I need more help?  What is smoking withdrawal like? How can I get information on withdrawal? GET READY  Set a quit date.  Change your environment by getting rid of all cigarettes, ashtrays, matches, and lighters in your home, car, or work. Do not let people smoke in your home.  Review your past attempts to quit. Think about what worked and what did not. GET SUPPORT AND ENCOURAGEMENT You have a better chance of being successful if you have help. You can get support in many ways.  Tell your family, friends, and co-workers that you are going to  quit and need their support. Ask them not to smoke around you.  Get individual, group, or telephone counseling and support. Programs are available at [facility] and health [facility]. Call your local health department for information about programs in your area.  Spiritual beliefs and practices may help some smokers quit.  Download a "quit meter" on your computer to keep track of quit statistics, such as how long you have gone without smoking, cigarettes not smoked, and money saved.  Get a self-help book about quitting smoking and staying off of tobacco. LEARN NEW SKILLS AND BEHAVIORS  Distract yourself from urges to smoke. Talk to someone, go for a walk, or occupy your time with a task.  Change your normal routine. Take a different route to work. Drink tea instead of coffee. Eat breakfast in a different place.  Reduce your stress. Take a hot bath, exercise, or read a book.  Plan something enjoyable to do every day. Reward yourself for not smoking.  Explore interactive web-based programs that specialize in helping you quit. GET MEDICINE AND USE IT CORRECTLY Medicines can help you stop smoking and decrease the urge to smoke. Combining medicine with the above behavioral methods and support can greatly increase your chances of successfully quitting smoking.  Nicotine replacement therapy helps deliver nicotine to your body without the negative effects and risks of smoking. Nicotine replacement therapy includes nicotine gum, lozenges, inhalers, nasal sprays, and skin patches. Some may be available over-the-counter and others require a prescription.  Antidepressant medicine helps people abstain from smoking, but how this works is unknown. This medicine is available by prescription.  Nicotinic receptor partial agonist medicine simulates the effect of nicotine in your brain. This medicine is available by prescription. Ask your caregiver for advice about which medicines to use and how to use  them based on your health history. Your caregiver will tell you what side effects to look out for if you choose to be on a medicine or therapy. Carefully read the information on the package. Do not use any other product containing nicotine while using a nicotine replacement product.  RELAPSE OR DIFFICULT SITUATIONS Most relapses occur within the first 3 months after quitting. Do not be discouraged if you start smoking again. Remember, most people try several times before finally quitting. You may have symptoms of withdrawal because your body is used to nicotine. You may crave cigarettes, be irritable, feel very hungry, cough often, get headaches, or have difficulty concentrating. The withdrawal symptoms are only temporary. They are strongest when you first quit, but they will go away within 10 14 days. To reduce the chances of relapse, try to:  Avoid drinking alcohol. Drinking lowers your chances of successfully quitting.  Reduce the amount of caffeine you consume. Once you quit smoking, the amount of caffeine in your body increases and  can give you symptoms, such as a rapid heartbeat, sweating, and anxiety.  Avoid smokers because they can make you want to smoke.  Do not let weight gain distract you. Many smokers will gain weight when they quit, usually less than 10 pounds. Eat a healthy diet and stay active. You can always lose the weight gained after you quit.  Find ways to improve your mood other than smoking. FOR MORE INFORMATION  www.smokefree.gov  Document Released: 06/09/2001 Document Revised: 12/15/2011 Document Reviewed: 09/24/2011 [facility] Patient Information 2014 [employer], [place].

## 2013-10-11 NOTE — Progress Notes (Signed)
Subjective:    Patient ID: Victoria Landry, female    DOB: 11/20/1955, 58 y.o.   MRN: [id number]  HPI Pt is here for regular wellness examination, and is feeling pretty well in general, and says chronic med probs are stable, except as noted below Past Medical History  Diagnosis Date  . ALLERGIC RHINITIS 02/02/2007  . HYPOTHYROIDISM, POST-RADIATION 03/01/2008  . SMOKER 03/01/2008  . VENTRICULAR HYPERTROPHY, LEFT 03/01/2008  . Hyperglycemia     Past Surgical History  Procedure Laterality Date  . Doppler echocardiography  10/26/2003  . Electrocardiogram  10/09/2005  . I-131 therapy  07/22/2002    History   Social History  . Marital Status: Single    Spouse Name: N/A    Number of Children: N/A  . Years of Education: N/A   Occupational History  . 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 Smoking status: Current Every Day Smoker  . Smokeless tobacco: Not on file  . Alcohol Use: Not on file  . Drug Use: Not on file  . Sexual Activity: Not on file   Other Topics Concern  . Not on file   Social History Narrative  . No narrative on file    Current Outpatient Prescriptions on File Prior to Visit  Medication Sig Dispense Refill  . buPROPion (WELLBUTRIN SR) 150 MG 12 hr tablet TAKE 1 TABLET BY MOUTH EVERY MORNING  30 tablet  10  . calcium carbonate (OS-CAL) 600 MG TABS Take 600 mg by mouth daily.      Marland Kitchen. lisinopril-hydrochlorothiazide (PRINZIDE,ZESTORETIC) 10-12.5 MG per tablet 1/2 tab daily  30 tablet  6  . lovastatin (MEVACOR) 40 MG tablet TAKE 1 TABLET BY MOUTH EVERY DAY  30 tablet  5  . Multiple Vitamin (MULTIVITAMIN) tablet Take 1 tablet by mouth daily.      Marland Kitchen. levothyroxine (SYNTHROID, LEVOTHROID) 175 MCG tablet Take 1 tablet (175 mcg total) by mouth daily.  30 tablet  11   No current facility-administered medications on file prior to visit.    No Known Allergies  Family History  Problem Relation Age of Onset  . Cancer Father     Lung Cancer    BP 122/78  Pulse 84   Temp(Src) 97.9 F (36.6 C) (Oral)  Ht 5\' 6"  (1.676 m)  Wt 195 lb (88.451 kg)  BMI 31.49 kg/m2  SpO2 94%  Review of Systems  Constitutional: Negative for fever.       She has weight-gain  HENT: Negative for hearing loss.   Eyes: Negative for visual disturbance.  Respiratory: Negative for shortness of breath.   Cardiovascular: Negative for chest pain.  Gastrointestinal: Negative for anal bleeding.  Endocrine: Negative for cold intolerance.  Genitourinary: Negative for hematuria.  Musculoskeletal: Negative for back pain.  Skin: Negative for rash.  Allergic/Immunologic: Negative for environmental allergies.  Neurological: Negative for syncope.  Hematological: Does not bruise/bleed easily.  Psychiatric/Behavioral: Negative for dysphoric mood.      Objective:   Physical Exam VS: see vs page GEN: no distress HEAD: head: no deformity eyes: no periorbital swelling, no proptosis external nose and ears are normal mouth: no lesion seen NECK: supple, thyroid is not enlarged CHEST WALL: no deformity BREASTS: sees gyn.   CV: reg rate and rhythm, no murmur ABD: abdomen is soft, nontender.  no hepatosplenomegaly.  not distended.  no hernia GENITALIA/RECTAL: sees gyn MUSCULOSKELETAL: muscle bulk and strength are grossly normal.  no obvious joint swelling.  gait is normal and  steady EXTEMITIES: no deformity.  no ulcer on the feet.  feet are of normal color and temp.  no edema PULSES: dorsalis pedis intact bilat.  no carotid bruit NEURO:  cn 2-12 grossly intact.   readily moves all 4's.  sensation is intact to touch on the feet SKIN:  Normal texture and temperature.  No rash or suspicious lesion is visible.   NODES:  None palpable at the neck PSYCH: alert, well-oriented.  Does not appear anxious nor depressed.   i reviewed electrocardiogram    Assessment & Plan:  Pt is here for regular wellness examination, and is feeling pretty well in general, and says chronic med probs are stable,  except as noted below we discussed code status.  pt requests full code, but would not want to be started or maintained on artificial life-support measures if there was not a reasonable chance of recovery     SEPARATE EVALUATION FOLLOWS--EACH PROBLEM HERE IS NEW, NOT RESPONDING TO TREATMENT, OR POSES SIGNIFICANT RISK TO THE PATIENT'S HEALTH: HISTORY OF THE PRESENT ILLNESS: Pt states a few weeks of moderate rash at the right leg, but no assoc ulcer PAST MEDICAL HISTORY reviewed and up to date today REVIEW OF SYSTEMS: The rash is very itchy.  She denies cough PHYSICAL EXAMINATION: VITAL SIGNS:  See vs page GENERAL: no distress Right ant tibial area: severe dyshidrosis.   LUNGS:  Clear to auscultation, except for rales at the bases. LAB/XRAY RESULTS: CXR result is noted i reviewed electrocardiogram Lab Results  Component Value Date   ALT 160* 10/11/2013   AST 187* 10/11/2013   ALKPHOS 51 10/11/2013   BILITOT 1.0 10/11/2013   Lab Results  Component Value Date   TSH 71.91* 10/11/2013  IMPRESSION: Abnormal ecg, new, uncertain etiology Rash, new, uncertain etiology Abnormal CXR, new, uncertain etiology Hypothyroidism: uncertain reason for poor control. Abnormal LFT, new, uncertain etiology PLAN: See instruction page

## 2013-10-20 ENCOUNTER — Other Ambulatory Visit: Payer: BC Managed Care – PPO

## 2013-10-22 ENCOUNTER — Telehealth: Payer: Self-pay | Admitting: Endocrinology

## 2013-10-22 DIAGNOSIS — R918 Other nonspecific abnormal finding of lung field: Secondary | ICD-10-CM

## 2013-10-22 NOTE — Telephone Encounter (Signed)
please call patient: Did you have your CT scan.  If not, why not?  i just want to make sure.

## 2013-10-23 NOTE — Telephone Encounter (Signed)
Unable to reach pt

## 2013-10-23 NOTE — Telephone Encounter (Signed)
Requested call back to discuss.  

## 2013-10-24 NOTE — Telephone Encounter (Signed)
Left voice mail for pt that she would be received a phone call about a time and date for an appointment.

## 2013-10-24 NOTE — Telephone Encounter (Signed)
please call patient: i ordered CT

## 2013-10-24 NOTE — Telephone Encounter (Signed)
Called patient and she states that she never received call about CT being set up. She states that she has not had it done.

## 2013-10-25 ENCOUNTER — Encounter: Payer: Self-pay | Admitting: Endocrinology

## 2013-10-25 ENCOUNTER — Ambulatory Visit (INDEPENDENT_AMBULATORY_CARE_PROVIDER_SITE_OTHER): Payer: BC Managed Care – PPO | Admitting: Endocrinology

## 2013-10-25 ENCOUNTER — Ambulatory Visit (INDEPENDENT_AMBULATORY_CARE_PROVIDER_SITE_OTHER)
Admission: RE | Admit: 2013-10-25 | Discharge: 2013-10-25 | Disposition: A | Discharge status: Home / Self Care | Payer: BC Managed Care – PPO | Source: Ambulatory Visit | Attending: Endocrinology | Admitting: Endocrinology

## 2013-10-25 VITALS — BP 128/98 | HR 102 | Temp 98.2°F | Ht 66.0 in | Wt 188.0 lb

## 2013-10-25 DIAGNOSIS — R7309 Other abnormal glucose: Secondary | ICD-10-CM

## 2013-10-25 DIAGNOSIS — F17200 Nicotine dependence, unspecified, uncomplicated: Secondary | ICD-10-CM

## 2013-10-25 DIAGNOSIS — R918 Other nonspecific abnormal finding of lung field: Secondary | ICD-10-CM

## 2013-10-25 MED ORDER — IOHEXOL 300 MG/ML  SOLN
80.0000 mL | Freq: Once | INTRAMUSCULAR | Status: AC | PRN
  Administered 2013-10-25: 80 mL via INTRAVENOUS

## 2013-10-25 NOTE — Progress Notes (Signed)
   Subjective:    Patient ID: Victoria Landry, female    DOB: 01/25/1956, 58 y.o.   MRN: [id number]  HPI The state of at least three ongoing medical problems is addressed today, with interval history of each noted here: Hypothyroidism: she feels much better back on the synthroid.   Abnormal cxr: she denies cough. Smoker: pt says she did not tolerate chantix, but wants to try other rx Past Medical History  Diagnosis Date  . ALLERGIC RHINITIS 02/02/2007  . HYPOTHYROIDISM, POST-RADIATION 03/01/2008  . SMOKER 03/01/2008  . VENTRICULAR HYPERTROPHY, LEFT 03/01/2008  . Hyperglycemia     Past Surgical History  Procedure Laterality Date  . Doppler echocardiography  10/26/2003  . Electrocardiogram  10/09/2005  . I-131 therapy  07/22/2002    History   Social History  . Marital Status: Single    Spouse Name: N/A    Number of Children: N/A  . Years of Education: N/A   Occupational History  . 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 Smoking status: Current Every Day Smoker  . Smokeless tobacco: Not on file  . Alcohol Use: Not on file  . Drug Use: Not on file  . Sexual Activity: Not on file   Other Topics Concern  . Not on file   Social History Narrative  . No narrative on file    Current Outpatient Prescriptions on File Prior to Visit  Medication Sig Dispense Refill  . buPROPion (WELLBUTRIN SR) 150 MG 12 hr tablet TAKE 1 TABLET BY MOUTH EVERY MORNING  30 tablet  10  . calcium carbonate (OS-CAL) 600 MG TABS Take 600 mg by mouth daily.      Marland Kitchen. levothyroxine (SYNTHROID, LEVOTHROID) 200 MCG tablet Take 1 tablet (200 mcg total) by mouth daily before breakfast.  30 tablet  2  . lisinopril-hydrochlorothiazide (PRINZIDE,ZESTORETIC) 10-12.5 MG per tablet 1/2 tab daily  30 tablet  6  . lovastatin (MEVACOR) 40 MG tablet TAKE 1 TABLET BY MOUTH EVERY DAY  30 tablet  5  . Multiple Vitamin (MULTIVITAMIN) tablet Take 1 tablet by mouth daily.      Marland Kitchen. triamcinolone cream (KENALOG) 0.1 % Apply 1  application topically 3 (three) times daily. As needed for rash or itching  45 g  2  . levothyroxine (SYNTHROID, LEVOTHROID) 175 MCG tablet Take 1 tablet (175 mcg total) by mouth daily.  30 tablet  11   No current facility-administered medications on file prior to visit.    No Known Allergies  Family History  Problem Relation Age of Onset  . Cancer Father     Lung Cancer    BP 128/98  Pulse 102  Temp(Src) 98.2 F (36.8 C) (Oral)  Ht 5\' 6"  (1.676 m)  Wt 188 lb (85.276 kg)  BMI 30.36 kg/m2  SpO2 96%  Review of Systems Denies sob and depression.    Objective:   Physical Exam VITAL SIGNS:  See vs page GENERAL: no distress LUNGS:  Clear to auscultation   (i reviewed spirometry result)    Assessment & Plan:  Abnormal CXR: I discussed with radiologist.  No malignancy is seen. Smoker: we discussed quitting.  Pt says she'll try a nicotine product. Restrictive pulm dz, uncertain etiology. Hypothyroidism: pt states she feels better in general, since back on synthroid.

## 2013-10-25 NOTE — Telephone Encounter (Signed)
Error

## 2013-10-25 NOTE — Patient Instructions (Signed)
Please let me know if you want to see a lung specialist. Try nicotine products such as patches or gum.   Please recheck the thyroid blood test here, in 2-4 weeks.

## 2013-11-15 ENCOUNTER — Other Ambulatory Visit: Payer: Self-pay

## 2013-11-15 ENCOUNTER — Other Ambulatory Visit (INDEPENDENT_AMBULATORY_CARE_PROVIDER_SITE_OTHER): Payer: BC Managed Care – PPO

## 2013-11-15 ENCOUNTER — Other Ambulatory Visit: Payer: Self-pay | Admitting: Endocrinology

## 2013-11-15 DIAGNOSIS — Z0000 Encounter for general adult medical examination without abnormal findings: Secondary | ICD-10-CM

## 2013-11-15 DIAGNOSIS — E890 Postprocedural hypothyroidism: Secondary | ICD-10-CM

## 2013-11-15 LAB — TSH: TSH: 0.08 u[IU]/mL — ABNORMAL LOW (ref 0.35–4.50)

## 2013-11-15 MED ORDER — LEVOTHYROXINE SODIUM 175 MCG PO TABS: 175.0000 ug | ORAL_TABLET | Freq: Every day | ORAL | Status: DC

## 2013-11-16 LAB — HEPATIC FUNCTION PANEL (6)
ALBUMIN: 4.5 g/dL (ref 3.5–5.5)
ALT: 26 IU/L (ref 0–32)
AST: 30 IU/L (ref 0–40)
Alkaline Phosphatase: 62 IU/L (ref 39–117)
BILIRUBIN TOTAL: 0.6 mg/dL (ref 0.0–1.2)
Bilirubin, Direct: 0.17 mg/dL (ref 0.00–0.40)

## 2014-01-06 ENCOUNTER — Other Ambulatory Visit: Payer: Self-pay | Admitting: Endocrinology

## 2014-01-09 ENCOUNTER — Other Ambulatory Visit: Payer: Self-pay | Admitting: Endocrinology

## 2014-04-10 ENCOUNTER — Other Ambulatory Visit: Payer: Self-pay | Admitting: Endocrinology

## 2014-04-16 ENCOUNTER — Other Ambulatory Visit: Payer: Self-pay | Admitting: Endocrinology

## 2014-06-18 IMAGING — CT CT CHEST W/ CM
2 of 3 series · 15 of 36 positions shown, 18 images · IV contrast (Omnipaque 300)
Comparison: Chest x-ray from 10/11/2013.

CLINICAL DATA: Abnormal chest x-ray

EXAM:
CT CHEST WITH CONTRAST
TECHNIQUE: Multidetector CT imaging of the chest was performed during
intravenous contrast administration.
CONTRAST:  80mL OMNIPAQUE IOHEXOL 300 MG/ML  SOLN

[Series 2: chest routine with · axial · 0.71mm/px · z∈[-228,-13]mm · 12 of 51 slices shown, 15 images]
[im 4/51  mediastinal]
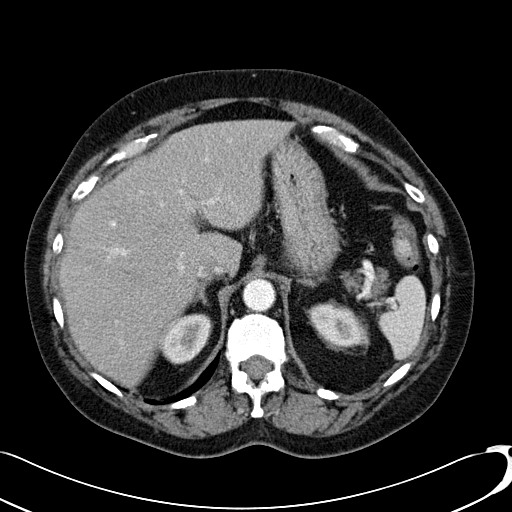
[im 4/51  lung]
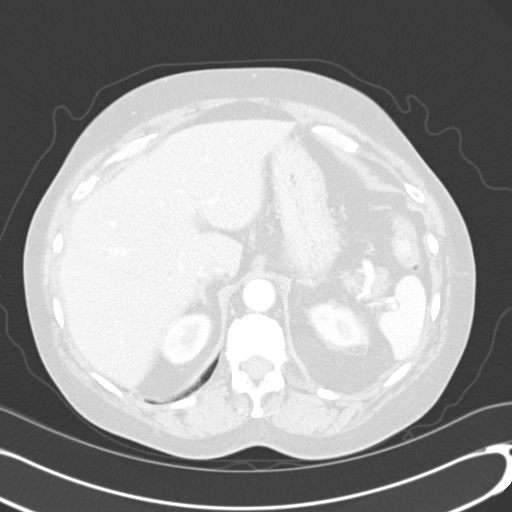
[im 8/51  lung]
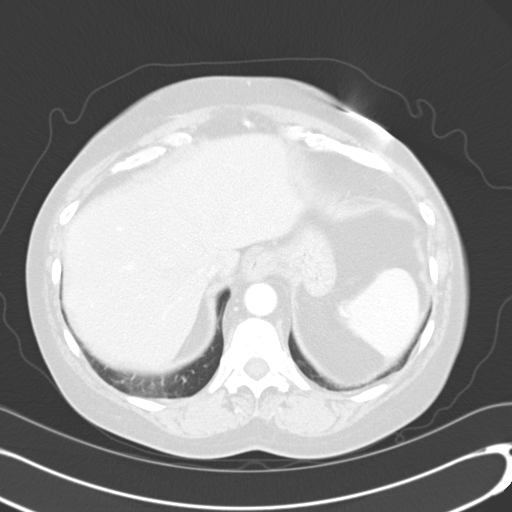
[im 12/51  lung]
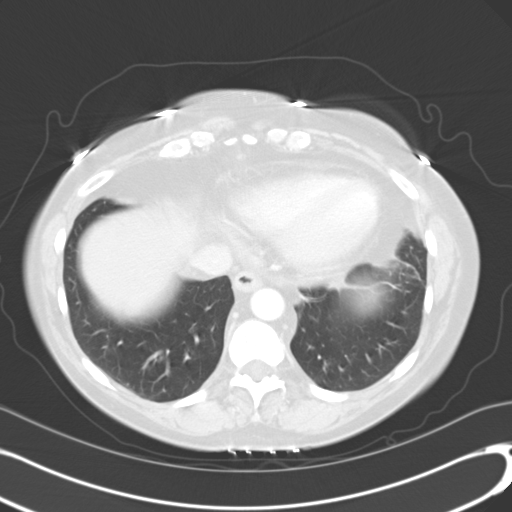
[im 15/51  lung]
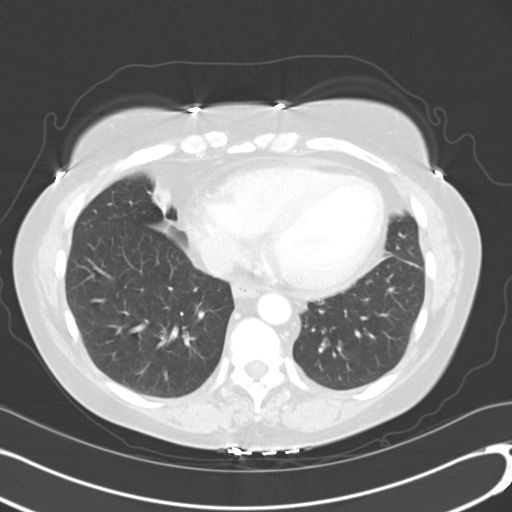
[im 19/51  mediastinal]
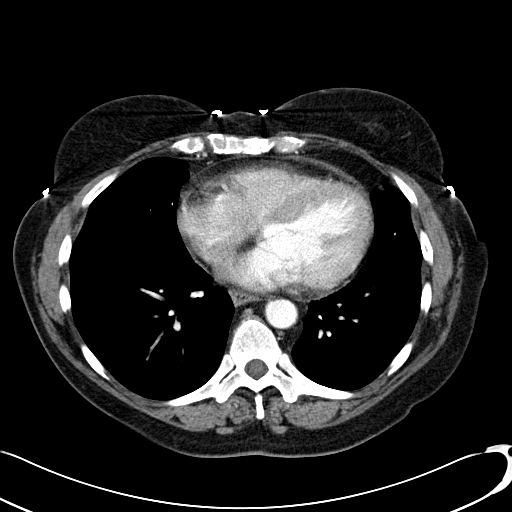
[im 19/51  lung]
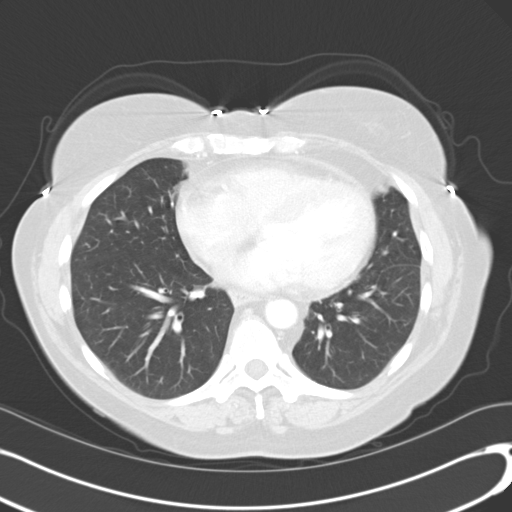
[im 23/51  lung]
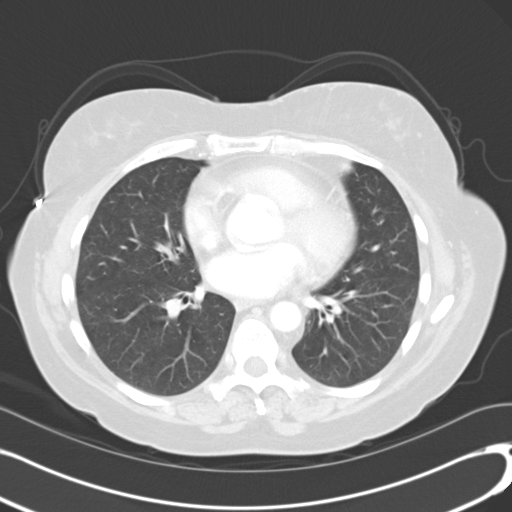
[im 28/51  lung]
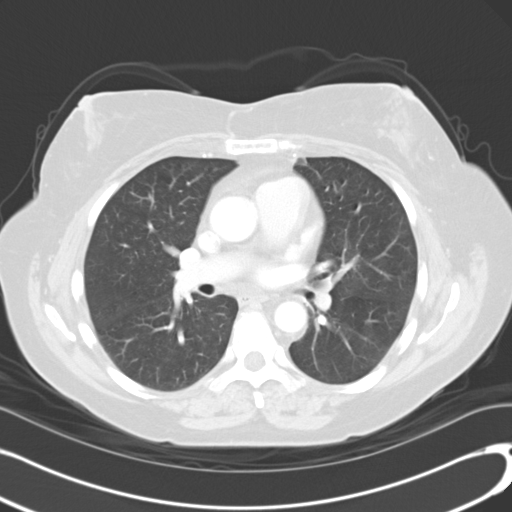
[im 32/51  lung]
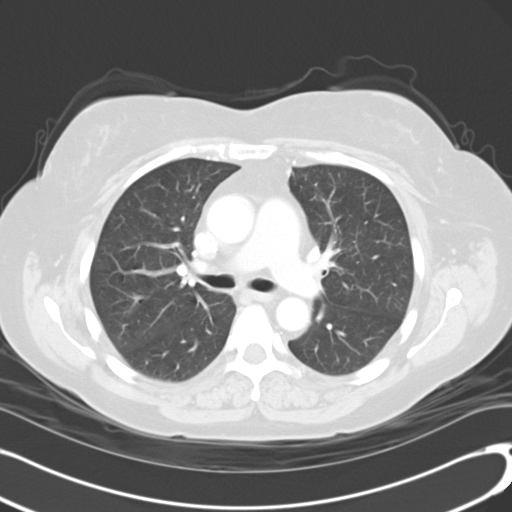
[im 36/51  mediastinal]
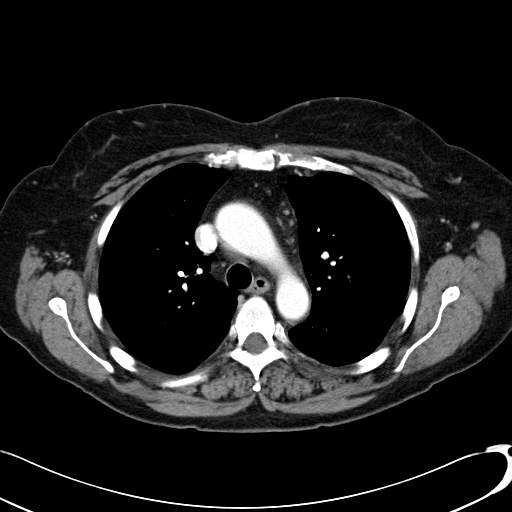
[im 36/51  lung]
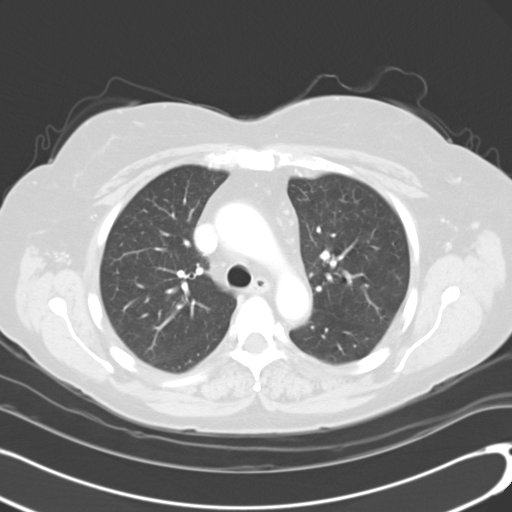
[im 39/51  lung]
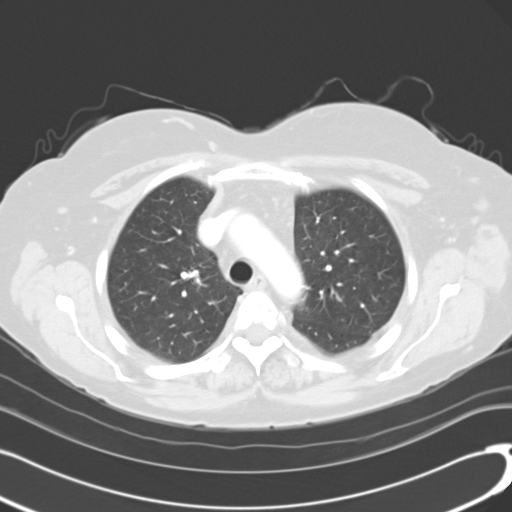
[im 43/51  lung]
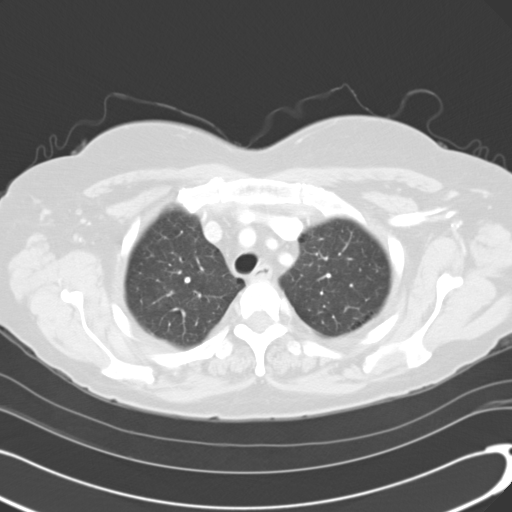
[im 47/51  lung]
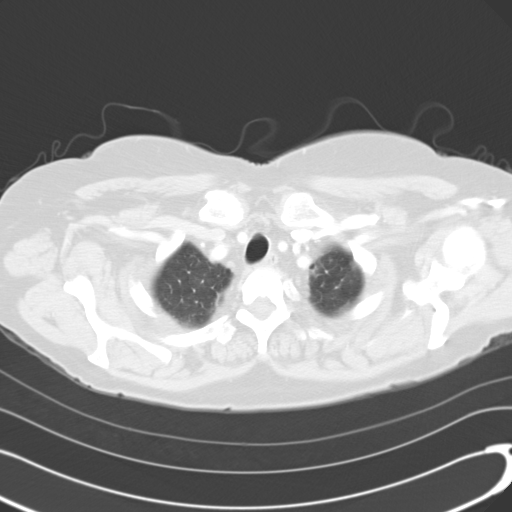

[Series 602: cor · coronal · 0.71mm/px · 3 of 99 slices shown]
[im 20/99  lung]
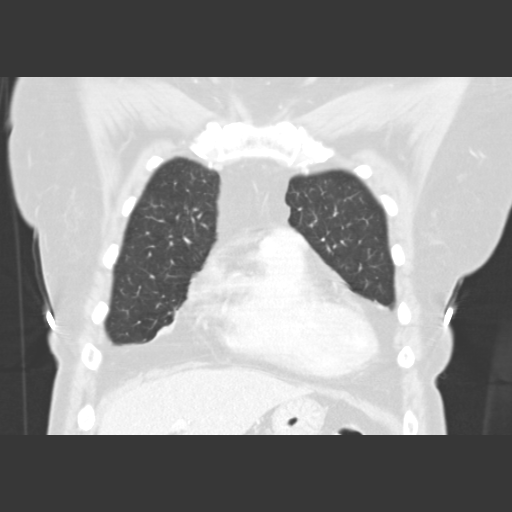
[im 40/99  lung]
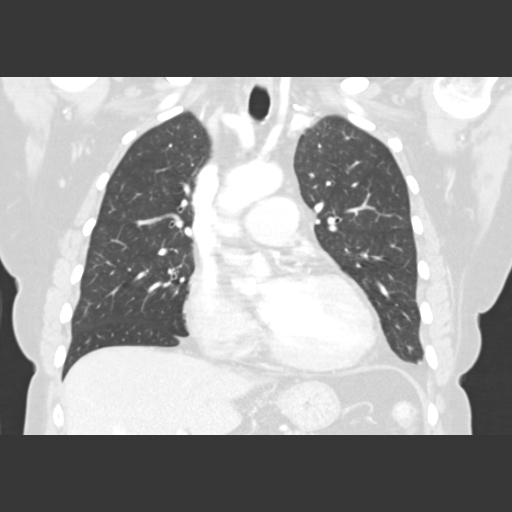
[im 59/99  lung]
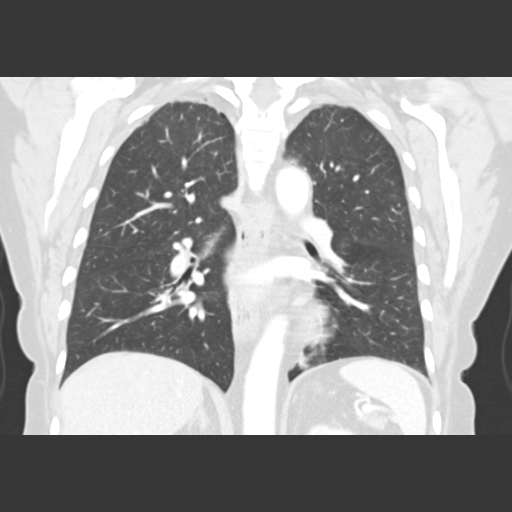

[15 of 36 positions shown; findings below may reference images not displayed]

FINDINGS: There is no axillary lymphadenopathy. No mediastinal or hilar
lymphadenopathy. Heart size is upper normal. No pericardial or
pleural effusion.

Lung windows show no focal airspace consolidation. No pulmonary
edema. Subsegmental atelectasis or scarring is seen in the right
middle lobe and lingula. No pulmonary mass lesion is evident.

Bone windows reveal no worrisome lytic or sclerotic osseous lesions.
IMPRESSION: No acute findings in the chest. The asymmetric soft tissue density
seen in the medial right upper hemi thorax on the previous chest
x-ray represents vascular anatomy in the upper mediastinum.

## 2014-07-22 ENCOUNTER — Other Ambulatory Visit: Payer: Self-pay | Admitting: Endocrinology

## 2014-08-24 ENCOUNTER — Other Ambulatory Visit: Payer: Self-pay | Admitting: Endocrinology

## 2014-11-25 ENCOUNTER — Other Ambulatory Visit: Payer: Self-pay | Admitting: Endocrinology

## 2014-11-27 NOTE — Telephone Encounter (Signed)
Please advise if ok to refill rx. Last office visit was 10/25/2013. Thanks!

## 2014-12-23 ENCOUNTER — Other Ambulatory Visit: Payer: Self-pay | Admitting: Endocrinology

## 2014-12-24 NOTE — Telephone Encounter (Signed)
Please advise if ok to refill. Last office visit was April 2015. Thanks!

## 2015-01-11 ENCOUNTER — Other Ambulatory Visit: Payer: Self-pay | Admitting: Endocrinology

## 2015-03-13 ENCOUNTER — Other Ambulatory Visit: Payer: Self-pay

## 2015-03-13 DIAGNOSIS — Z1231 Encounter for screening mammogram for malignant neoplasm of breast: Secondary | ICD-10-CM

## 2015-03-19 ENCOUNTER — Ambulatory Visit
Admission: RE | Admit: 2015-03-19 | Discharge: 2015-03-19 | Disposition: A | Discharge status: Home / Self Care | Payer: BLUE CROSS/BLUE SHIELD | Source: Ambulatory Visit

## 2015-03-19 DIAGNOSIS — Z1231 Encounter for screening mammogram for malignant neoplasm of breast: Secondary | ICD-10-CM

## 2015-04-18 ENCOUNTER — Other Ambulatory Visit: Payer: Self-pay | Admitting: Endocrinology

## 2015-04-23 ENCOUNTER — Other Ambulatory Visit: Payer: Self-pay | Admitting: Endocrinology

## 2015-04-27 ENCOUNTER — Other Ambulatory Visit: Payer: Self-pay | Admitting: Endocrinology

## 2015-04-29 NOTE — Telephone Encounter (Signed)
Please advise if ok to refill. Last office visit was 10/25/2013. Thanks!

## 2015-08-12 ENCOUNTER — Other Ambulatory Visit: Payer: Self-pay | Admitting: Endocrinology

## 2015-08-12 NOTE — Telephone Encounter (Signed)
Please refill x 1 Ov is due  

## 2015-08-12 NOTE — Telephone Encounter (Signed)
Rx submitted. Appointment letter mailed to the pt.  

## 2015-08-12 NOTE — Telephone Encounter (Signed)
Please advise if ok refill, last office visit was 10/25/2013.

## 2015-09-16 ENCOUNTER — Other Ambulatory Visit: Payer: Self-pay | Admitting: Endocrinology

## 2015-09-25 ENCOUNTER — Ambulatory Visit (INDEPENDENT_AMBULATORY_CARE_PROVIDER_SITE_OTHER): Payer: BLUE CROSS/BLUE SHIELD | Admitting: Endocrinology

## 2015-09-25 ENCOUNTER — Encounter: Payer: Self-pay | Admitting: Endocrinology

## 2015-09-25 VITALS — BP 136/87 | HR 85 | Temp 98.6°F | Ht 66.0 in | Wt 185.0 lb

## 2015-09-25 DIAGNOSIS — Z0000 Encounter for general adult medical examination without abnormal findings: Secondary | ICD-10-CM

## 2015-09-25 DIAGNOSIS — F17200 Nicotine dependence, unspecified, uncomplicated: Secondary | ICD-10-CM | POA: Diagnosis not present

## 2015-09-25 LAB — URINALYSIS, ROUTINE W REFLEX MICROSCOPIC
Bilirubin Urine: NEGATIVE
Hgb urine dipstick: NEGATIVE
Ketones, ur: NEGATIVE
Leukocytes, UA: NEGATIVE
Nitrite: NEGATIVE
PH: 5.5 (ref 5.0–8.0)
Total Protein, Urine: NEGATIVE
URINE GLUCOSE: NEGATIVE
Urobilinogen, UA: 0.2 (ref 0.0–1.0)

## 2015-09-25 LAB — CBC WITH DIFFERENTIAL/PLATELET
Basophils Absolute: 0.1 10*3/uL (ref 0.0–0.1)
Basophils Relative: 0.7 % (ref 0.0–3.0)
Eosinophils Absolute: 0.4 10*3/uL (ref 0.0–0.7)
Eosinophils Relative: 5.1 % — ABNORMAL HIGH (ref 0.0–5.0)
HCT: 40.5 % (ref 36.0–46.0)
Hemoglobin: 13.9 g/dL (ref 12.0–15.0)
LYMPHS ABS: 2.6 10*3/uL (ref 0.7–4.0)
Lymphocytes Relative: 32.6 % (ref 12.0–46.0)
MCHC: 34.3 g/dL (ref 30.0–36.0)
MCV: 89.2 fl (ref 78.0–100.0)
MONOS PCT: 12 % (ref 3.0–12.0)
Monocytes Absolute: 0.9 10*3/uL (ref 0.1–1.0)
NEUTROS PCT: 49.6 % (ref 43.0–77.0)
Neutro Abs: 3.9 10*3/uL (ref 1.4–7.7)
PLATELETS: 251 10*3/uL (ref 150.0–400.0)
RBC: 4.54 Mil/uL (ref 3.87–5.11)
RDW: 13.8 % (ref 11.5–15.5)
WBC: 7.9 10*3/uL (ref 4.0–10.5)

## 2015-09-25 LAB — BASIC METABOLIC PANEL
BUN: 21 mg/dL (ref 6–23)
CALCIUM: 9.9 mg/dL (ref 8.4–10.5)
CO2: 29 mEq/L (ref 19–32)
Chloride: 104 mEq/L (ref 96–112)
Creatinine, Ser: 0.8 mg/dL (ref 0.40–1.20)
GFR: 78.49 mL/min (ref >60.00)
Glucose, Bld: 105 mg/dL — ABNORMAL HIGH (ref 70–99)
Potassium: 4.5 mEq/L (ref 3.5–5.1)
SODIUM: 139 meq/L (ref 135–145)

## 2015-09-25 LAB — LIPID PANEL
Cholesterol: 176 mg/dL (ref 0–200)
HDL: 54.6 mg/dL (ref >39.00)
LDL CALC: 107 mg/dL — AB (ref 0–99)
NONHDL: 121.59
Total CHOL/HDL Ratio: 3
Triglycerides: 72 mg/dL (ref 0.0–149.0)
VLDL: 14.4 mg/dL (ref 0.0–40.0)

## 2015-09-25 LAB — HEPATIC FUNCTION PANEL
ALK PHOS: 75 U/L (ref 39–117)
ALT: 14 U/L (ref 0–35)
AST: 24 U/L (ref 0–37)
Albumin: 4.2 g/dL (ref 3.5–5.2)
Bilirubin, Direct: 0 mg/dL (ref 0.0–0.3)
Total Bilirubin: 0.4 mg/dL (ref 0.2–1.2)
Total Protein: 7.5 g/dL (ref 6.0–8.3)

## 2015-09-25 LAB — TSH: TSH: 0.93 u[IU]/mL (ref 0.35–4.50)

## 2015-09-25 NOTE — Progress Notes (Signed)
Subjective:    Patient ID: Victoria Landry, female    DOB: December 07, 1955, 60 y.o.   MRN: [id number]  HPI Pt is here for regular wellness examination, and is feeling pretty well in general, and says chronic med probs are stable, except as noted below.  She has gained weight. Past Medical History  Diagnosis Date  . ALLERGIC RHINITIS 02/02/2007  . HYPOTHYROIDISM, POST-RADIATION 03/01/2008  . SMOKER 03/01/2008  . VENTRICULAR HYPERTROPHY, LEFT 03/01/2008  . Hyperglycemia     Past Surgical History  Procedure Laterality Date  . Doppler echocardiography  10/26/2003  . Electrocardiogram  10/09/2005  . I-131 therapy  07/22/2002    Social History   Social History  . Marital Status: Single    Spouse Name: N/A  . Number of Children: N/A  . Years of Education: N/A   Occupational History  . Works Nurse, children's    Social History Main Topics  . Smoking status: Current Every Day Smoker  . Smokeless tobacco: Not on file  . Alcohol Use: Not on file  . Drug Use: Not on file  . Sexual Activity: Not on file   Other Topics Concern  . Not on file   Social History Narrative    Current Outpatient Prescriptions on File Prior to Visit  Medication Sig Dispense Refill  . calcium carbonate (OS-CAL) 600 MG TABS Take 600 mg by mouth daily.    Marland Kitchen levothyroxine (SYNTHROID, LEVOTHROID) 200 MCG tablet TAKE 1 TABLET BY MOUTH DAILY BEFORE BREAKFAST. 30 tablet 0  . Multiple Vitamin (MULTIVITAMIN) tablet Take 1 tablet by mouth daily.     No current facility-administered medications on file prior to visit.    No Known Allergies  Family History  Problem Relation Age of Onset  . Cancer Father     Lung Cancer    BP 136/87 mmHg  Pulse 85  Temp(Src) 98.6 F (37 C) (Oral)  Ht  (1.676 m)  Wt 185 lb (83.915 kg)  BMI 29.87 kg/m2  SpO2 92%  Review of Systems  Constitutional: Negative for fever.  HENT: Negative for hearing loss.   Eyes: Negative for visual disturbance.  Respiratory: Negative for shortness of  breath.   Cardiovascular: Negative for chest pain.  Gastrointestinal: Negative for anal bleeding.  Endocrine: Negative for cold intolerance.  Genitourinary: Negative for hematuria.  Musculoskeletal: Negative for back pain.  Skin: Negative for rash.  Allergic/Immunologic: Negative for environmental allergies.  Neurological: Negative for syncope and headaches.  Hematological: Does not bruise/bleed easily.  Psychiatric/Behavioral: Negative for dysphoric mood.       Objective:   Physical Exam VS: see vs page GEN: no distress HEAD: head: no deformity eyes: no periorbital swelling, no proptosis external nose and ears are normal mouth: no lesion seen NECK: supple, thyroid is not enlarged CHEST WALL: no deformity LUNGS:  Clear to auscultation BREASTS: sees gyn CV: reg rate and rhythm, no murmur ABD: abdomen is soft, nontender.  no hepatosplenomegaly.  not distended.  no hernia GENITALIA/RECTAL: sees gyn MUSCULOSKELETAL: muscle bulk and strength are grossly normal.  no obvious joint swelling.  gait is normal and steady EXTEMITIES: no deformity.  no ulcer on the feet.  feet are of normal color and temp.  no edema PULSES: dorsalis pedis intact bilat.  no carotid bruit NEURO:  cn 2-12 grossly intact.   readily moves all 4's.  sensation is intact to touch on the feet SKIN:  Normal texture and temperature.  No rash or suspicious lesion is visible.  NODES:  None palpable at the neck PSYCH: alert, well-oriented.  Does not appear anxious nor depressed.  i personally reviewed electrocardiogram tracing (today): Indication: wellness Impression: normal    Assessment & Plan:  Wellness visit today, with problems stable, except as noted.  Patient is advised the following: Patient Instructions  blood tests are requested for you today.  We'll let you know about the results. Let's check a CT scan for lung cancer screening.  you will receive a phone call, about a day and time for an  appointment. please consider these measures for your health:  minimize alcohol.  do not use tobacco products.  have a colonoscopy at least every 10 years from age 58.  Women should have an annual mammogram from age 58.  keep firearms safely stored.  always use seat belts.  have working smoke alarms in your home.  see an eye doctor and dentist regularly.  never drive under the influence of alcohol or drugs (including prescription drugs).  those with fair skin should take precautions against the sun. Please return in 1 year.

## 2015-09-25 NOTE — Progress Notes (Signed)
we discussed code status.  pt requests full code, but would not want to be started or maintained on artificial life-support measures if there was not a reasonable chance of recovery 

## 2015-09-25 NOTE — Patient Instructions (Addendum)
blood tests are requested for you today.  We'll let you know about the results. Let's check a CT scan for lung cancer screening.  you will receive a phone call, about a day and time for an appointment. please consider these measures for your health:  minimize alcohol.  do not use tobacco products.  have a colonoscopy at least every 10 years from age 58.  Women should have an annual mammogram from age 58.  keep firearms safely stored.  always use seat belts.  have working smoke alarms in your home.  see an eye doctor and dentist regularly.  never drive under the influence of alcohol or drugs (including prescription drugs).  those with fair skin should take precautions against the sun. Please return in 1 year.

## 2015-09-26 LAB — HIV ANTIBODY (ROUTINE TESTING W REFLEX): HIV: NONREACTIVE

## 2015-09-26 LAB — HEPATITIS C ANTIBODY: HCV AB: NEGATIVE

## 2015-09-30 ENCOUNTER — Telehealth: Payer: Self-pay | Admitting: Endocrinology

## 2015-09-30 MED ORDER — LEVOTHYROXINE SODIUM 200 MCG PO TABS: ORAL_TABLET | ORAL | Status: DC

## 2015-09-30 NOTE — Telephone Encounter (Signed)
Rx submitted per pt's request.  

## 2015-09-30 NOTE — Telephone Encounter (Signed)
Thyroid med needs to be called in [facility] to [facility]

## 2015-11-14 ENCOUNTER — Other Ambulatory Visit: Payer: Self-pay | Admitting: Endocrinology

## 2015-11-14 DIAGNOSIS — F17200 Nicotine dependence, unspecified, uncomplicated: Secondary | ICD-10-CM

## 2016-02-20 ENCOUNTER — Telehealth: Payer: Self-pay | Admitting: Acute Care

## 2016-02-20 NOTE — Telephone Encounter (Signed)
Referral was canceled on 12/12/15 for lung cancer screening program due to lack of communication with the patient. I sent notification to Dr. Everardo AllEllison through fax on 12/12/15. Forms were sent to medical records to be scanned. Forms were received back on 02/14/16 stating that it can not be scanned into chart. Nothing further needed.

## 2016-03-18 ENCOUNTER — Other Ambulatory Visit: Payer: Self-pay | Admitting: Gynecology

## 2016-03-18 DIAGNOSIS — Z1231 Encounter for screening mammogram for malignant neoplasm of breast: Secondary | ICD-10-CM

## 2016-03-25 ENCOUNTER — Ambulatory Visit
Admission: RE | Admit: 2016-03-25 | Discharge: 2016-03-25 | Disposition: A | Discharge status: Home / Self Care | Payer: BLUE CROSS/BLUE SHIELD | Source: Ambulatory Visit | Attending: Gynecology | Admitting: Gynecology

## 2016-03-25 DIAGNOSIS — Z1231 Encounter for screening mammogram for malignant neoplasm of breast: Secondary | ICD-10-CM

## 2016-03-26 ENCOUNTER — Other Ambulatory Visit: Payer: Self-pay | Admitting: Endocrinology

## 2016-10-09 ENCOUNTER — Other Ambulatory Visit: Payer: Self-pay | Admitting: Endocrinology

## 2017-05-14 ENCOUNTER — Telehealth: Payer: Self-pay | Admitting: *Deleted

## 2017-05-14 ENCOUNTER — Other Ambulatory Visit: Payer: Self-pay

## 2017-05-14 MED ORDER — LEVOTHYROXINE SODIUM 200 MCG PO TABS: ORAL_TABLET | ORAL | 2 refills | Status: DC

## 2017-05-14 NOTE — Telephone Encounter (Signed)
Patient called and states she needs a refill of her Synthroid. Her pharmacy is [facility] on [address]. Patient has an upcoming appt on Dec 18th for a CPE. Please call to let her know when it is sent to the pharmacy [phone]. Please advise. Thank you

## 2017-05-14 NOTE — Telephone Encounter (Signed)
Called and notified patient I sent prescription in with one refill.

## 2017-06-15 ENCOUNTER — Encounter: Payer: BLUE CROSS/BLUE SHIELD | Admitting: Endocrinology

## 2017-07-14 ENCOUNTER — Other Ambulatory Visit: Payer: Self-pay

## 2017-07-14 MED ORDER — LEVOTHYROXINE SODIUM 200 MCG PO TABS: ORAL_TABLET | ORAL | 1 refills | Status: DC

## 2017-07-22 ENCOUNTER — Ambulatory Visit (INDEPENDENT_AMBULATORY_CARE_PROVIDER_SITE_OTHER): Payer: BLUE CROSS/BLUE SHIELD | Admitting: Endocrinology

## 2017-07-22 ENCOUNTER — Encounter: Payer: Self-pay | Admitting: Endocrinology

## 2017-07-22 VITALS — BP 142/84 | HR 82 | Ht 66.0 in | Wt 182.0 lb

## 2017-07-22 DIAGNOSIS — R0989 Other specified symptoms and signs involving the circulatory and respiratory systems: Secondary | ICD-10-CM

## 2017-07-22 DIAGNOSIS — Z23 Encounter for immunization: Secondary | ICD-10-CM

## 2017-07-22 DIAGNOSIS — Z0000 Encounter for general adult medical examination without abnormal findings: Secondary | ICD-10-CM | POA: Diagnosis not present

## 2017-07-22 LAB — CBC WITH DIFFERENTIAL/PLATELET
BASOS ABS: 0.1 10*3/uL (ref 0.0–0.1)
Basophils Relative: 1.3 % (ref 0.0–3.0)
EOS PCT: 6.8 % — AB (ref 0.0–5.0)
Eosinophils Absolute: 0.4 10*3/uL (ref 0.0–0.7)
HEMATOCRIT: 40 % (ref 36.0–46.0)
HEMOGLOBIN: 13.7 g/dL (ref 12.0–15.0)
LYMPHS PCT: 32.3 % (ref 12.0–46.0)
Lymphs Abs: 2 10*3/uL (ref 0.7–4.0)
MCHC: 34.4 g/dL (ref 30.0–36.0)
MCV: 91.6 fl (ref 78.0–100.0)
MONOS PCT: 12.9 % — AB (ref 3.0–12.0)
Monocytes Absolute: 0.8 10*3/uL (ref 0.1–1.0)
Neutro Abs: 2.9 10*3/uL (ref 1.4–7.7)
Neutrophils Relative %: 46.7 % (ref 43.0–77.0)
Platelets: 261 10*3/uL (ref 150.0–400.0)
RBC: 4.36 Mil/uL (ref 3.87–5.11)
RDW: 14.3 % (ref 11.5–15.5)
WBC: 6.3 10*3/uL (ref 4.0–10.5)

## 2017-07-22 LAB — URINALYSIS, ROUTINE W REFLEX MICROSCOPIC
BILIRUBIN URINE: NEGATIVE
HGB URINE DIPSTICK: NEGATIVE
Ketones, ur: NEGATIVE
LEUKOCYTES UA: NEGATIVE
NITRITE: NEGATIVE
PH: 5.5 (ref 5.0–8.0)
RBC / HPF: NONE SEEN (ref 0–?)
Specific Gravity, Urine: <=1.005 — AB (ref 1.000–1.030)
Total Protein, Urine: NEGATIVE
Urine Glucose: NEGATIVE
Urobilinogen, UA: 0.2 (ref 0.0–1.0)

## 2017-07-22 LAB — BASIC METABOLIC PANEL
BUN: 16 mg/dL (ref 6–23)
CHLORIDE: 102 meq/L (ref 96–112)
CO2: 28 meq/L (ref 19–32)
CREATININE: 0.67 mg/dL (ref 0.40–1.20)
Calcium: 9.7 mg/dL (ref 8.4–10.5)
GFR: 95.71 mL/min (ref >60.00)
Glucose, Bld: 101 mg/dL — ABNORMAL HIGH (ref 70–99)
Potassium: 4.3 mEq/L (ref 3.5–5.1)
Sodium: 137 mEq/L (ref 135–145)

## 2017-07-22 LAB — TSH: TSH: 0.34 u[IU]/mL — ABNORMAL LOW (ref 0.35–4.50)

## 2017-07-22 LAB — LIPID PANEL
CHOL/HDL RATIO: 3
Cholesterol: 189 mg/dL (ref 0–200)
HDL: 57.8 mg/dL (ref >39.00)
LDL Cholesterol: 112 mg/dL — ABNORMAL HIGH (ref 0–99)
NONHDL: 131.25
TRIGLYCERIDES: 98 mg/dL (ref 0.0–149.0)
VLDL: 19.6 mg/dL (ref 0.0–40.0)

## 2017-07-22 LAB — HEPATIC FUNCTION PANEL
ALBUMIN: 4.2 g/dL (ref 3.5–5.2)
ALK PHOS: 67 U/L (ref 39–117)
ALT: 13 U/L (ref 0–35)
AST: 22 U/L (ref 0–37)
Bilirubin, Direct: 0.1 mg/dL (ref 0.0–0.3)
TOTAL PROTEIN: 7.6 g/dL (ref 6.0–8.3)
Total Bilirubin: 0.7 mg/dL (ref 0.2–1.2)

## 2017-07-22 LAB — HEMOGLOBIN A1C: Hgb A1c MFr Bld: 5.9 % (ref 4.6–6.5)

## 2017-07-22 MED ORDER — LEVOTHYROXINE SODIUM 175 MCG PO TABS: 175.0000 ug | ORAL_TABLET | Freq: Every day | ORAL | 3 refills | Status: DC

## 2017-07-22 NOTE — Patient Instructions (Addendum)
blood tests are requested for you today.  We'll let you know about the results.  Let's recheck the carotid artery ultrasound.  you will receive a phone call, about a day and time for an appointment. Please consider these measures for your health:  minimize alcohol.  Do not use tobacco products.  Have a colonoscopy at least every 10 years from age 60.  Women should have an annual mammogram from age 60.  Keep firearms safely stored.  Always use seat belts.  have working smoke alarms in your home.  See an eye doctor and dentist regularly.  Never drive under the influence of alcohol or drugs (including prescription drugs).  Those with fair skin should take precautions against the sun, and should carefully examine their skin once per month, for any new or changed moles. Please come back for a follow-up appointment in 1 year.

## 2017-07-22 NOTE — Progress Notes (Signed)
Subjective:    Patient ID: Victoria Landry, female    DOB: 08-May-1956, 62 y.o.   MRN: [id number]  HPI Pt is here for regular wellness examination, and is feeling pretty well in general, and says chronic med probs are stable, except as noted below.  DEXA is done by GYN.  She declines medication to quit smoking.   Past Medical History:  Diagnosis Date  . ALLERGIC RHINITIS 02/02/2007  . Hyperglycemia   . HYPOTHYROIDISM, POST-RADIATION 03/01/2008  . SMOKER 03/01/2008  . VENTRICULAR HYPERTROPHY, LEFT 03/01/2008     Social History   Socioeconomic History  . Marital status: Single    Spouse name: Not on file  . Number of children: Not on file  . Years of education: Not on file  . Highest education level: Not on file  Social Needs  . Financial resource strain: Not on file  . Food insecurity - worry: Not on file  . Food insecurity - inability: Not on file  . Transportation needs - medical: Not on file  . Transportation needs - non-medical: Not on file  Occupational History  . Occupation: Works Nurse, children's  Tobacco Use  . Smoking status: Current Every Day Smoker  . Smokeless tobacco: Never Used  Substance and Sexual Activity  . Alcohol use: Yes  . Drug use: Not on file  . Sexual activity: Not on file  Other Topics Concern  . Not on file  Social History Narrative  . Not on file    Current Outpatient Medications on File Prior to Visit  Medication Sig Dispense Refill  . calcium carbonate (OS-CAL) 600 MG TABS Take 600 mg by mouth daily.    . Multiple Vitamin (MULTIVITAMIN) tablet Take 1 tablet by mouth daily.     No current facility-administered medications on file prior to visit.     No Known Allergies  Family History  Problem Relation Age of Onset  . Cancer Father        Lung Cancer    BP (!) 142/84   Pulse 82   Ht 5\' 6"  (1.676 m)   Wt 182 lb (82.6 kg)   SpO2 91%   BMI 29.38 kg/m    Review of Systems Denies fever, fatigue, visual loss, hearing loss, chest pain, sob, back  pain, depression, cold intolerance, BRBPR, hematuria, syncope, numbness, allergy sxs, easy bruising, and rash.      Objective:   Physical Exam VS: see vs page GEN: no distress HEAD: head: no deformity eyes: no periorbital swelling, no proptosis external nose and ears are normal mouth: no lesion seen NECK: supple, thyroid is not enlarged CHEST WALL: no deformity LUNGS:  Clear to auscultation BREASTS: sees gyn CV: reg rate and rhythm, no murmur ABD: abdomen is soft, nontender.  no hepatosplenomegaly.  not distended.  no hernia GENITALIA/RECTAL: sees gyn.  MUSCULOSKELETAL: muscle bulk and strength are grossly normal.  no obvious joint swelling.  gait is normal and steady EXTEMITIES: no deformity.  no ulcer on the feet.  feet are of normal color and temp.  no edema PULSES: dorsalis pedis intact bilat.  left carotid bruit is noted NEURO:  cn 2-12 grossly intact.   readily moves all 4's.  sensation is intact to touch on the feet SKIN:  Normal texture and temperature.  No rash or suspicious lesion is visible.   NODES:  None palpable at the neck PSYCH: alert, well-oriented.  Does not appear anxious nor depressed.  I personally reviewed electrocardiogram tracing (today): Indication: wellness Impression:  NSR.  No MI.  No hypertrophy.  In my opinion, ST changed are WNL Compared to 2017: no significant change     Assessment & Plan:  Wellness visit today, with problems stable, except as noted.  Carotid bruit, new.  Patient Instructions  blood tests are requested for you today.  We'll let you know about the results.  Let's recheck the carotid artery ultrasound.  you will receive a phone call, about a day and time for an appointment. Please consider these measures for your health:  minimize alcohol.  Do not use tobacco products.  Have a colonoscopy at least every 10 years from age 60.  Women should have an annual mammogram from age 60.  Keep firearms safely stored.  Always use seat belts.   have working smoke alarms in your home.  See an eye doctor and dentist regularly.  Never drive under the influence of alcohol or drugs (including prescription drugs).  Those with fair skin should take precautions against the sun, and should carefully examine their skin once per month, for any new or changed moles. Please come back for a follow-up appointment in 1 year.

## 2018-07-17 ENCOUNTER — Other Ambulatory Visit: Payer: Self-pay | Admitting: Endocrinology

## 2018-07-17 NOTE — Telephone Encounter (Signed)
Options:  Please refill x 1.  Ov is due  Please refill x 3 months.  Further refills would have to be considered by new PCP.

## 2018-07-18 NOTE — Telephone Encounter (Signed)
Called pt to inquire further re: how she wishes to proceed. LVM requesting returned call.

## 2018-07-22 ENCOUNTER — Ambulatory Visit: Payer: BLUE CROSS/BLUE SHIELD | Admitting: Endocrinology

## 2018-07-29 ENCOUNTER — Encounter: Payer: Self-pay | Admitting: Endocrinology

## 2018-07-29 ENCOUNTER — Ambulatory Visit: Payer: BLUE CROSS/BLUE SHIELD | Admitting: Endocrinology

## 2018-07-29 VITALS — BP 152/90 | HR 93 | Ht 66.0 in | Wt 188.2 lb

## 2018-07-29 DIAGNOSIS — R0989 Other specified symptoms and signs involving the circulatory and respiratory systems: Secondary | ICD-10-CM

## 2018-07-29 DIAGNOSIS — Z0000 Encounter for general adult medical examination without abnormal findings: Secondary | ICD-10-CM

## 2018-07-29 LAB — BASIC METABOLIC PANEL
BUN: 16 mg/dL (ref 6–23)
CALCIUM: 9.7 mg/dL (ref 8.4–10.5)
CO2: 29 mEq/L (ref 19–32)
Chloride: 102 mEq/L (ref 96–112)
Creatinine, Ser: 0.91 mg/dL (ref 0.40–1.20)
GFR: 63.03 mL/min (ref >60.00)
Glucose, Bld: 100 mg/dL — ABNORMAL HIGH (ref 70–99)
Potassium: 4.5 mEq/L (ref 3.5–5.1)
Sodium: 137 mEq/L (ref 135–145)

## 2018-07-29 LAB — CBC WITH DIFFERENTIAL/PLATELET
Basophils Absolute: 0.1 K/uL (ref 0.0–0.1)
Basophils Relative: 0.8 % (ref 0.0–3.0)
Eosinophils Absolute: 0.2 K/uL (ref 0.0–0.7)
Eosinophils Relative: 2.2 % (ref 0.0–5.0)
HCT: 43.8 % (ref 36.0–46.0)
Hemoglobin: 15 g/dL (ref 12.0–15.0)
Lymphocytes Relative: 24.3 % (ref 12.0–46.0)
Lymphs Abs: 2 K/uL (ref 0.7–4.0)
MCHC: 34.3 g/dL (ref 30.0–36.0)
MCV: 90.4 fl (ref 78.0–100.0)
Monocytes Absolute: 0.9 K/uL (ref 0.1–1.0)
Monocytes Relative: 10.8 % (ref 3.0–12.0)
Neutro Abs: 5.1 K/uL (ref 1.4–7.7)
Neutrophils Relative %: 61.9 % (ref 43.0–77.0)
Platelets: 279 K/uL (ref 150.0–400.0)
RBC: 4.85 Mil/uL (ref 3.87–5.11)
RDW: 14.3 % (ref 11.5–15.5)
WBC: 8.2 K/uL (ref 4.0–10.5)

## 2018-07-29 LAB — URINALYSIS, ROUTINE W REFLEX MICROSCOPIC
Bilirubin Urine: NEGATIVE
Hgb urine dipstick: NEGATIVE
Ketones, ur: NEGATIVE
Leukocytes, UA: NEGATIVE
Nitrite: NEGATIVE
RBC / HPF: NONE SEEN
Specific Gravity, Urine: 1.02 (ref 1.000–1.030)
Total Protein, Urine: NEGATIVE
Urine Glucose: NEGATIVE
Urobilinogen, UA: 0.2 (ref 0.0–1.0)
WBC, UA: NONE SEEN
pH: 8 (ref 5.0–8.0)

## 2018-07-29 LAB — LIPID PANEL
Cholesterol: 209 mg/dL — ABNORMAL HIGH (ref 0–200)
HDL: 61 mg/dL (ref >39.00)
LDL Cholesterol: 110 mg/dL — ABNORMAL HIGH (ref 0–99)
NONHDL: 147.93
Total CHOL/HDL Ratio: 3
Triglycerides: 191 mg/dL — ABNORMAL HIGH (ref 0.0–149.0)
VLDL: 38.2 mg/dL (ref 0.0–40.0)

## 2018-07-29 LAB — HEPATIC FUNCTION PANEL
ALT: 14 U/L (ref 0–35)
AST: 22 U/L (ref 0–37)
Albumin: 4.3 g/dL (ref 3.5–5.2)
Alkaline Phosphatase: 80 U/L (ref 39–117)
Bilirubin, Direct: 0.1 mg/dL (ref 0.0–0.3)
Total Bilirubin: 0.8 mg/dL (ref 0.2–1.2)
Total Protein: 7.4 g/dL (ref 6.0–8.3)

## 2018-07-29 LAB — TSH: TSH: 0.16 u[IU]/mL — ABNORMAL LOW (ref 0.35–4.50)

## 2018-07-29 MED ORDER — LOSARTAN POTASSIUM-HCTZ 50-12.5 MG PO TABS: 0.5000 | ORAL_TABLET | Freq: Every day | ORAL | 5 refills | Status: DC

## 2018-07-29 MED ORDER — LEVOTHYROXINE SODIUM 150 MCG PO TABS: 150.0000 ug | ORAL_TABLET | Freq: Every day | ORAL | 3 refills | Status: DC

## 2018-07-29 NOTE — Patient Instructions (Addendum)
I have sent a prescription to your pharmacy, for the blood pressure. Best wishes with your new primary care provider.  Here is a list of offices.  Blood tests are requested for you today.  We'll let you know about the results.  Based on the results, I'll send a refill of the thyroid pill.

## 2018-07-29 NOTE — Progress Notes (Signed)
Subjective:    Patient ID: Victoria Landry, female    DOB: 02/25/1956, 63 y.o.   MRN: [id number]  HPI HTN is noted again today.  pt states she feels well in general.  She has took BP medication in 2017.   She takes synthroid as rx'ed.   Past Medical History:  Diagnosis Date  . ALLERGIC RHINITIS 02/02/2007  . Hyperglycemia   . HYPOTHYROIDISM, POST-RADIATION 03/01/2008  . SMOKER 03/01/2008  . VENTRICULAR HYPERTROPHY, LEFT 03/01/2008     Social History   Socioeconomic History  . Marital status: Single    Spouse name: Not on file  . Number of children: Not on file  . Years of education: Not on file  . Highest education level: Not on file  Occupational History  . Occupation: Works Nurse, mental health  . Financial resource strain: Not on file  . Food insecurity:    Worry: Not on file    Inability: Not on file  . Transportation needs:    Medical: Not on file    Non-medical: Not on file  Tobacco Use  . Smoking status: Current Every Day Smoker  . Smokeless tobacco: Never Used  Substance and Sexual Activity  . Alcohol use: Yes  . Drug use: Not on file  . Sexual activity: Not on file  Lifestyle  . Physical activity:    Days per week: Not on file    Minutes per session: Not on file  . Stress: Not on file  Relationships  . Social connections:    Talks on phone: Not on file    Gets together: Not on file    Attends religious service: Not on file    Active member of club or organization: Not on file    Attends meetings of clubs or organizations: Not on file    Relationship status: Not on file  . Intimate partner violence:    Fear of current or ex partner: Not on file    Emotionally abused: Not on file    Physically abused: Not on file    Forced sexual activity: Not on file  Other Topics Concern  . Not on file  Social History Narrative  . Not on file    Current Outpatient Medications on File Prior to Visit  Medication Sig Dispense Refill  . calcium carbonate (OS-CAL) 600  MG TABS Take 600 mg by mouth daily.    . Multiple Vitamin (MULTIVITAMIN) tablet Take 1 tablet by mouth daily.     No current facility-administered medications on file prior to visit.     No Known Allergies  Family History  Problem Relation Age of Onset  . Cancer Father        Lung Cancer    BP (!) 152/90 (BP Location: Left Arm, Patient Position: Sitting, Cuff Size: Normal)   Pulse 93   Ht 5\' 6"  (1.676 m)   Wt 188 lb 3.2 oz (85.4 kg)   SpO2 99%   BMI 30.38 kg/m    Review of Systems Denies sob.     Objective:   Physical Exam VITAL SIGNS:  See vs page GENERAL: no distress LUNGS:  Clear to auscultation HEART:  Regular rate and rhythm without murmurs noted. Normal S1,S2.        Assessment & Plan:  HTN: worse Hypothyroidism: recheck today  Patient Instructions  I have sent a prescription to your pharmacy, for the blood pressure. Best wishes with your new primary care provider.  Here is a list  of offices.  Blood tests are requested for you today.  We'll let you know about the results.  Based on the results, I'll send a refill of the thyroid pill.

## 2018-08-16 ENCOUNTER — Other Ambulatory Visit: Payer: Self-pay | Admitting: Endocrinology

## 2018-08-27 ENCOUNTER — Other Ambulatory Visit: Payer: Self-pay | Admitting: Endocrinology

## 2019-04-05 ENCOUNTER — Encounter: Payer: Self-pay | Admitting: Gastroenterology

## 2019-06-09 ENCOUNTER — Other Ambulatory Visit: Payer: Self-pay | Admitting: Endocrinology

## 2019-06-09 DIAGNOSIS — Z1231 Encounter for screening mammogram for malignant neoplasm of breast: Secondary | ICD-10-CM

## 2019-06-15 ENCOUNTER — Other Ambulatory Visit: Payer: Self-pay | Admitting: Endocrinology

## 2019-06-15 NOTE — Telephone Encounter (Signed)
Please refill x 1 Further refills would have to be considered by new PCP  

## 2019-07-07 ENCOUNTER — Other Ambulatory Visit: Payer: Self-pay | Admitting: Endocrinology

## 2019-07-07 NOTE — Telephone Encounter (Signed)
Please advise 

## 2019-07-07 NOTE — Telephone Encounter (Signed)
Please refill x 3 months Further refills would have to be considered by new PCP   

## 2019-07-07 NOTE — Telephone Encounter (Signed)
Please review your response below:  Romero Belling, MD      06/15/19 7:18 AM Note   Please refill x 1 Further refills would have to be considered by new PCP       Do you still wish to refill for 90 days?

## 2019-07-08 NOTE — Telephone Encounter (Signed)
30 days please

## 2019-07-26 ENCOUNTER — Other Ambulatory Visit: Payer: Self-pay | Admitting: Endocrinology

## 2019-07-26 NOTE — Telephone Encounter (Signed)
Options: 1.  Please schedule f/u appt 2.  Then please refill x 1, pending that appt.  OR:  Please forward refill request to pt's primary care provider.  

## 2019-07-26 NOTE — Telephone Encounter (Signed)
Per Dr. Ellison, unable to refill Levothyroxine without an appt. Routing this message to the front desk for scheduling purposes.  

## 2019-07-26 NOTE — Telephone Encounter (Signed)
Please advise. Uncertain if you are still managing or if PCP is now managing

## 2019-07-27 ENCOUNTER — Encounter: Payer: Self-pay | Admitting: Endocrinology

## 2019-07-27 NOTE — Telephone Encounter (Signed)
Attempted to contact patient but there was no answer and no VM set up will try again at a later date

## 2019-07-27 NOTE — Telephone Encounter (Signed)
Letter has been mailed to pt.

## 2019-08-12 ENCOUNTER — Other Ambulatory Visit: Payer: Self-pay | Admitting: Endocrinology

## 2019-08-12 NOTE — Telephone Encounter (Signed)
Please forward refill request to pt's primary care provider.   

## 2019-08-23 ENCOUNTER — Telehealth: Payer: Self-pay | Admitting: Endocrinology

## 2019-08-23 ENCOUNTER — Other Ambulatory Visit: Payer: Self-pay

## 2019-08-23 MED ORDER — LEVOTHYROXINE SODIUM 150 MCG PO TABS: 150.0000 ug | ORAL_TABLET | Freq: Every day | ORAL | 3 refills | Status: DC

## 2019-08-23 NOTE — Telephone Encounter (Signed)
Please forward refill request to pt's primary care provider.   

## 2019-08-23 NOTE — Telephone Encounter (Signed)
Called pt per Dr. George Hugh orders to inform her about his response below. Automated voice states call cannot be completed at this time.

## 2019-08-23 NOTE — Telephone Encounter (Signed)
Levothyroxine Rx sent as indiated below. However, please refer to notes below re: Hyzaar and advise  Outpatient Medication Detail   Disp Refills Start End   levothyroxine (SYNTHROID) 150 MCG tablet 90 tablet 3 08/23/2019    Sig - Route: Take 1 tablet (150 mcg total) by mouth daily. - Oral   Sent to pharmacy as: levothyroxine (SYNTHROID) 150 MCG tablet   E-Prescribing Status: Receipt confirmed by pharmacy (08/23/2019  2:42 PM EST)    FROM DR. ELLISON:  1:14 PM Note   Please forward refill request to pt's primary care provider.       Orders Placed  None Refused Medication Requests   Losartan Potassium-HCTZ 0.5 tablets Oral Daily, SEND FUTURE REFILLS TO NEW PCP. NO LONGER MANAGED BY DR. Everardo All

## 2019-08-23 NOTE — Telephone Encounter (Signed)
MEDICATION: levothyroxine, hyzaar   PHARMACY:   [id number] - [place], [place] - [address] Phone:  [phone]  Fax:  [phone]       IS THIS A 90 DAY SUPPLY : no - one month  IS PATIENT OUT OF MEDICATION: yes, both  IF NOT; HOW MUCH IS LEFT:   LAST APPOINTMENT DATE: 07/29/18  NEXT APPOINTMENT DATE: 09/07/2019  DO WE HAVE YOUR PERMISSION TO LEAVE A DETAILED MESSAGE: yes  OTHER COMMENTS: patient is requesting to have enough RX to last until her appointment on 09/07/19   **Let patient know to contact pharmacy at the end of the day to make sure medication is ready. **  ** Please notify patient to allow 48-72 hours to process**  **Encourage patient to contact the pharmacy for refills or they can request refills through [facility]**

## 2019-08-23 NOTE — Telephone Encounter (Signed)
SECOND ATTEMPT:  Again called pt to inform about Dr. George Hugh response to refilling Hyaar. Automated voice again indicated call cannot be completed at this time and to try again later.

## 2019-08-24 NOTE — Telephone Encounter (Signed)
THIRD AND FINAL ATTEMPT:  Called pt to inform about Dr. Everardo All response re: Hyzaar. Once again, call cannot be completed as dialed. Pt will need to request from PCP AND if not established with a PCP, will need to do so. Per Dr. Everardo All, he no longer manages this medication.

## 2019-09-06 ENCOUNTER — Other Ambulatory Visit: Payer: Self-pay

## 2019-09-07 ENCOUNTER — Ambulatory Visit: Payer: BC Managed Care – PPO | Admitting: Endocrinology

## 2019-09-07 ENCOUNTER — Encounter: Payer: Self-pay | Admitting: Endocrinology

## 2019-09-07 DIAGNOSIS — I10 Essential (primary) hypertension: Secondary | ICD-10-CM | POA: Diagnosis not present

## 2019-09-07 DIAGNOSIS — E039 Hypothyroidism, unspecified: Secondary | ICD-10-CM | POA: Insufficient documentation

## 2019-09-07 DIAGNOSIS — E890 Postprocedural hypothyroidism: Secondary | ICD-10-CM

## 2019-09-07 MED ORDER — LOSARTAN POTASSIUM-HCTZ 50-12.5 MG PO TABS: 0.5000 | ORAL_TABLET | Freq: Every day | ORAL | 0 refills | Status: DC

## 2019-09-07 NOTE — Progress Notes (Signed)
Subjective:    Patient ID: Victoria Landry, female    DOB: [birth year], 64 y.o.   MRN: [id number]  HPI Pt returns for f/u of post-RAI hypothyroidism (in 203, she had RAI rx for hyperthyroidism due to Grave;s Dz; she has been on synthroid since then).  pt states she feels well in general.  She takes synthroid as rx'ed.   Past Medical History:  Diagnosis Date  . ALLERGIC RHINITIS 02/02/2007  . Hyperglycemia   . HYPOTHYROIDISM, POST-RADIATION 03/01/2008  . SMOKER 03/01/2008  . VENTRICULAR HYPERTROPHY, LEFT 03/01/2008     Social History   Socioeconomic History  . Marital status: Single    Spouse name: Not on file  . Number of children: Not on file  . Years of education: Not on file  . Highest education level: Not on file  Occupational History  . Occupation: Works Nurse, adult  Tobacco Use  . Smoking status: Current Every Day Smoker  . Smokeless tobacco: Never Used  Substance and Sexual Activity  . Alcohol use: Yes  . Drug use: Not on file  . Sexual activity: Not on file  Other Topics Concern  . Not on file  Social History Narrative  . Not on file   Social Determinants of Health   Financial Resource Strain:   . Difficulty of Paying Living Expenses:   Food Insecurity:   . Worried About Charity fundraiser in the Last Year:   . Arboriculturist in the Last Year:   Transportation Needs:   . Film/video editor (Medical):   Marland Kitchen Lack of Transportation (Non-Medical):   Physical Activity:   . Days of Exercise per Week:   . Minutes of Exercise per Session:   Stress:   . Feeling of Stress :   Social Connections:   . Frequency of Communication with Friends and Family:   . Frequency of Social Gatherings with Friends and Family:   . Attends Religious Services:   . Active Member of Clubs or Organizations:   . Attends Archivist Meetings:   Marland Kitchen Marital Status:   Intimate Partner Violence:   . Fear of Current or Ex-Partner:   . Emotionally Abused:   Marland Kitchen Physically Abused:   .  Sexually Abused:     Current Outpatient Medications on File Prior to Visit  Medication Sig Dispense Refill  . calcium carbonate (OS-CAL) 600 MG TABS Take 600 mg by mouth daily.    . Multiple Vitamin (MULTIVITAMIN) tablet Take 1 tablet by mouth daily.     No current facility-administered medications on file prior to visit.    No Known Allergies  Family History  Problem Relation Age of Onset  . Cancer Father        Lung Cancer    BP 140/88   Pulse 99   Ht 5\' 6"  (1.676 m)   Wt 194 lb 3.2 oz (88.1 kg)   SpO2 97%   BMI 31.34 kg/m    Review of Systems Denies sob.      Objective:   Physical Exam VITAL SIGNS:  See vs page GENERAL: no distress NECK: There is no palpable thyroid enlargement.  No thyroid nodule is palpable.  No palpable lymphadenopathy at the anterior neck.   Lab Results  Component Value Date   TSH 22.13 (H) 09/07/2019      Assessment & Plan:  HTN: is noted today.   Hypothyroidism, worse.  I have sent a prescription to your pharmacy, to increase synthroid  Patient  Instructions  I have sent a prescription to your pharmacy, to refill your 2 meds.   Blood tests are requested for you today.  We'll let you know about the results.   Here is a phone number, to call to get a new primary care provider.   I would be happy to see you back here as needed.

## 2019-09-07 NOTE — Patient Instructions (Signed)
I have sent a prescription to your pharmacy, to refill your 2 meds.   Blood tests are requested for you today.  We'll let you know about the results.   Here is a phone number, to call to get a new primary care provider.   I would be happy to see you back here as needed.

## 2019-09-08 LAB — T4, FREE: Free T4: 1.07 ng/dL (ref 0.60–1.60)

## 2019-09-08 LAB — BASIC METABOLIC PANEL
BUN: 15 mg/dL (ref 6–23)
CO2: 27 mEq/L (ref 19–32)
Calcium: 9.7 mg/dL (ref 8.4–10.5)
Chloride: 100 mEq/L (ref 96–112)
Creatinine, Ser: 0.89 mg/dL (ref 0.40–1.20)
GFR: 64.42 mL/min (ref >60.00)
Glucose, Bld: 89 mg/dL (ref 70–99)
Potassium: 4 mEq/L (ref 3.5–5.1)
Sodium: 136 mEq/L (ref 135–145)

## 2019-09-08 LAB — TSH: TSH: 22.13 u[IU]/mL — ABNORMAL HIGH (ref 0.35–4.50)

## 2019-09-08 MED ORDER — LEVOTHYROXINE SODIUM 175 MCG PO TABS: 175.0000 ug | ORAL_TABLET | Freq: Every day | ORAL | 3 refills | Status: DC

## 2019-12-07 ENCOUNTER — Other Ambulatory Visit: Payer: Self-pay | Admitting: Endocrinology

## 2019-12-07 NOTE — Telephone Encounter (Signed)
Please review and advise.

## 2019-12-07 NOTE — Telephone Encounter (Signed)
Please forward refill request to pt's primary care provider.   

## 2020-09-06 ENCOUNTER — Other Ambulatory Visit: Payer: Self-pay | Admitting: Endocrinology

## 2020-10-22 ENCOUNTER — Other Ambulatory Visit: Payer: Self-pay | Admitting: Endocrinology

## 2020-10-23 ENCOUNTER — Telehealth: Payer: Self-pay | Admitting: Endocrinology

## 2020-10-23 ENCOUNTER — Other Ambulatory Visit: Payer: Self-pay | Admitting: *Deleted

## 2020-10-23 DIAGNOSIS — E890 Postprocedural hypothyroidism: Secondary | ICD-10-CM

## 2020-10-23 MED ORDER — LEVOTHYROXINE SODIUM 175 MCG PO TABS: 175.0000 ug | ORAL_TABLET | Freq: Every day | ORAL | 0 refills | Status: DC

## 2020-10-23 NOTE — Telephone Encounter (Signed)
Rx levothyroxine #60, R-0.

## 2020-10-23 NOTE — Telephone Encounter (Signed)
Pt calling to F/u with the Prescription that was faxed over for levothyroxine (SYNTHROID) 175 MCG tablet understands that her app is 11/27/2020 Pt is wondering if prescription can be sent in until she has her app.   [id number] - [place], [place] - [address]

## 2020-11-27 ENCOUNTER — Ambulatory Visit (INDEPENDENT_AMBULATORY_CARE_PROVIDER_SITE_OTHER): Payer: BC Managed Care – PPO | Admitting: Endocrinology

## 2020-11-27 ENCOUNTER — Other Ambulatory Visit: Payer: Self-pay

## 2020-11-27 VITALS — BP 140/80 | HR 108 | Ht 66.0 in | Wt 199.0 lb

## 2020-11-27 DIAGNOSIS — E890 Postprocedural hypothyroidism: Secondary | ICD-10-CM | POA: Diagnosis not present

## 2020-11-27 LAB — T4, FREE: Free T4: 1.74 ng/dL — ABNORMAL HIGH (ref 0.60–1.60)

## 2020-11-27 LAB — TSH: TSH: 0.34 u[IU]/mL — ABNORMAL LOW (ref 0.35–4.50)

## 2020-11-27 MED ORDER — LEVOTHYROXINE SODIUM 150 MCG PO TABS: 150.0000 ug | ORAL_TABLET | Freq: Every day | ORAL | 3 refills | Status: DC

## 2020-11-27 NOTE — Progress Notes (Signed)
   Subjective:    Patient ID: Victoria Landry, female    DOB: [birth year], 65 y.o.   MRN: [id number]  HPI Pt returns for f/u of post-RAI hypothyroidism (in 2003, she had RAI rx for hyperthyroidism due to Grave's Dz (due to error, RAI rx is not in epic); she has been on synthroid since then).  pt states she feels well in general.  She takes synthroid as rx'ed.    Past Medical History:  Diagnosis Date  . ALLERGIC RHINITIS 02/02/2007  . Hyperglycemia   . HYPOTHYROIDISM, POST-RADIATION 03/01/2008  . SMOKER 03/01/2008  . VENTRICULAR HYPERTROPHY, LEFT 03/01/2008    Social History   Socioeconomic History  . Marital status: Single    Spouse name: Not on file  . Number of children: Not on file  . Years of education: Not on file  . Highest education level: Not on file  Occupational History  . Occupation: Works Nurse, children's  Tobacco Use  . Smoking status: Current Every Day Smoker  . Smokeless tobacco: Never Used  Substance and Sexual Activity  . Alcohol use: Yes  . Drug use: Not on file  . Sexual activity: Not on file  Other Topics Concern  . Not on file  Social History Narrative  . Not on file   Social Determinants of Health   Financial Resource Strain: Not on file  Food Insecurity: Not on file  Transportation Needs: Not on file  Physical Activity: Not on file  Stress: Not on file  Social Connections: Not on file  Intimate Partner Violence: Not on file    Current Outpatient Medications on File Prior to Visit  Medication Sig Dispense Refill  . calcium carbonate (OS-CAL) 600 MG TABS Take 600 mg by mouth daily.    Marland Kitchen losartan-hydrochlorothiazide (HYZAAR) 50-12.5 MG tablet Take 0.5 tablets by mouth daily. 45 tablet 0  . Multiple Vitamin (MULTIVITAMIN) tablet Take 1 tablet by mouth daily.     No current facility-administered medications on file prior to visit.    No Known Allergies  Family History  Problem Relation Age of Onset  . Cancer Father        Lung Cancer    BP 140/80 (BP  Location: Right Arm, Patient Position: Sitting, Cuff Size: Large)   Pulse (!) 108   Ht 5\' 6"  (1.676 m)   Wt 199 lb (90.3 kg)   SpO2 92%   BMI 32.12 kg/m    Review of Systems     Objective:   Physical Exam VITAL SIGNS:  See vs page.   GENERAL: no distress.   NECK: There is no palpable thyroid enlargement.  No thyroid nodule is palpable.  No palpable lymphadenopathy at the anterior neck.  Lab Results  Component Value Date   TSH 0.34 (L) 11/27/2020      Assessment & Plan:  Hypothyroidism: overcontrolled.  I have sent a prescription to your pharmacy, to reduce synthroid

## 2020-11-27 NOTE — Patient Instructions (Addendum)
Thyroid blood tests are requested for you today.  We'll let you know about the results.   Please come back for a follow-up appointment in 1 year.   

## 2020-12-02 ENCOUNTER — Encounter: Payer: Self-pay | Admitting: Endocrinology

## 2020-12-03 MED ORDER — LOSARTAN POTASSIUM-HCTZ 50-12.5 MG PO TABS: 0.5000 | ORAL_TABLET | Freq: Every day | ORAL | 0 refills | Status: DC

## 2020-12-15 ENCOUNTER — Other Ambulatory Visit: Payer: Self-pay | Admitting: Endocrinology

## 2020-12-15 DIAGNOSIS — E890 Postprocedural hypothyroidism: Secondary | ICD-10-CM

## 2021-02-28 ENCOUNTER — Other Ambulatory Visit: Payer: Self-pay | Admitting: Endocrinology

## 2021-06-01 ENCOUNTER — Other Ambulatory Visit: Payer: Self-pay | Admitting: Endocrinology

## 2021-10-28 ENCOUNTER — Ambulatory Visit: Payer: BC Managed Care – PPO | Admitting: Endocrinology

## 2021-10-28 VITALS — BP 158/96 | HR 92 | Ht 66.0 in | Wt 192.6 lb

## 2021-10-28 DIAGNOSIS — E890 Postprocedural hypothyroidism: Secondary | ICD-10-CM

## 2021-10-28 LAB — BASIC METABOLIC PANEL
BUN: 19 mg/dL (ref 6–23)
CO2: 27 mEq/L (ref 19–32)
Calcium: 10.1 mg/dL (ref 8.4–10.5)
Chloride: 101 mEq/L (ref 96–112)
Creatinine, Ser: 0.97 mg/dL (ref 0.40–1.20)
GFR: 62.23 mL/min (ref >60.00)
Glucose, Bld: 89 mg/dL (ref 70–99)
Potassium: 4.4 mEq/L (ref 3.5–5.1)
Sodium: 136 mEq/L (ref 135–145)

## 2021-10-28 LAB — TSH: TSH: 1.05 u[IU]/mL (ref 0.35–5.50)

## 2021-10-28 LAB — T4, FREE: Free T4: 1.3 ng/dL (ref 0.60–1.60)

## 2021-10-28 MED ORDER — LOSARTAN POTASSIUM-HCTZ 50-12.5 MG PO TABS: 0.5000 | ORAL_TABLET | Freq: Every day | ORAL | 0 refills | Status: AC

## 2021-10-28 MED ORDER — LEVOTHYROXINE SODIUM 150 MCG PO TABS: 150.0000 ug | ORAL_TABLET | Freq: Every day | ORAL | 1 refills | Status: DC

## 2021-10-28 NOTE — Patient Instructions (Addendum)
blood tests are requested for you today.  We'll let you know about the results.   ?I have sent a prescription to your pharmacy, to refill the blood pressure medication.  It is really important to get set up with a primary care provider, to follow this up soon.   ?You should have an endocrinology follow-up appointment in 1 year, or your new primary care provider can check on the thyroid.  ?

## 2021-10-28 NOTE — Progress Notes (Signed)
? ?  Subjective:  ? ? Patient ID: Victoria Landry, female    DOB: 1955/07/09, 66 y.o.   MRN: [id number] ? ?HPI ?Pt returns for f/u of post-RAI hypothyroidism (in 2003, she had RAI rx for hyperthyroidism due to Grave's Dz (due to error, RAI rx is not in epic); she has been on synthroid since then).  pt states she feels well in general.  She takes synthroid as rx'ed.   ?Past Medical History:  ?Diagnosis Date  ? ALLERGIC RHINITIS 02/02/2007  ? Hyperglycemia   ? HYPOTHYROIDISM, POST-RADIATION 03/01/2008  ? SMOKER 03/01/2008  ? VENTRICULAR HYPERTROPHY, LEFT 03/01/2008  ? ? ?Social History  ? ?Socioeconomic History  ? Marital status: Single  ?  Spouse name: Not on file  ? Number of children: Not on file  ? Years of education: Not on file  ? Highest education level: Not on file  ?Occupational History  ? Occupation: Works Nurse, children's  ?Tobacco Use  ? Smoking status: Every Day  ? Smokeless tobacco: Never  ?Substance and Sexual Activity  ? Alcohol use: Yes  ? Drug use: Not on file  ? Sexual activity: Not on file  ?Other Topics Concern  ? Not on file  ?Social History Narrative  ? Not on file  ? ?Social Determinants of Health  ? ?Financial Resource Strain: Not on file  ?Food Insecurity: Not on file  ?Transportation Needs: Not on file  ?Physical Activity: Not on file  ?Stress: Not on file  ?Social Connections: Not on file  ?Intimate Partner Violence: Not on file  ? ? ?Current Outpatient Medications on File Prior to Visit  ?Medication Sig Dispense Refill  ? calcium carbonate (OS-CAL) 600 MG TABS Take 600 mg by mouth daily.    ? Multiple Vitamin (MULTIVITAMIN) tablet Take 1 tablet by mouth daily.    ? ?No current facility-administered medications on file prior to visit.  ? ? ?No Known Allergies ? ?Family History  ?Problem Relation Age of Onset  ? Cancer Father   ?     Lung Cancer  ? ? ?BP (!) 158/96 (BP Location: Left Arm, Patient Position: Sitting, Cuff Size: Normal)   Pulse 92   Ht 5\' 6"  (1.676 m)   Wt 192 lb 9.6 oz (87.4 kg)   SpO2 93%    BMI 31.09 kg/m?  ? ? ?Review of Systems ? ?   ?Objective:  ? Physical Exam ?VITAL SIGNS:  See vs page ?GENERAL: no distress ?NECK: There is no palpable thyroid enlargement.  No thyroid nodule is palpable.  No palpable lymphadenopathy at the anterior neck.   ? ? ?Lab Results  ?Component Value Date  ? TSH 1.05 10/28/2021  ? ?   ?Assessment & Plan:  ?Hypothyroidism: well-controlled.  Please continue the same synthroid ? ?

## 2022-05-27 ENCOUNTER — Other Ambulatory Visit: Payer: Self-pay

## 2022-05-27 MED ORDER — LEVOTHYROXINE SODIUM 150 MCG PO TABS: 150.0000 ug | ORAL_TABLET | Freq: Every day | ORAL | 0 refills | Status: DC

## 2022-08-29 ENCOUNTER — Other Ambulatory Visit: Payer: Self-pay | Admitting: Internal Medicine

## 2022-09-15 NOTE — Telephone Encounter (Signed)
Patient requested refill of levothyroxine (SYNTHROID) 150 MCG tablet .  Pharmacy is [facility] on [address]. Appointment scheduled with Dr Skipper Cliche on 10/28/22

## 2022-09-20 ENCOUNTER — Other Ambulatory Visit: Payer: Self-pay | Admitting: Internal Medicine

## 2022-09-20 MED ORDER — LEVOTHYROXINE SODIUM 150 MCG PO TABS: 150.0000 ug | ORAL_TABLET | Freq: Every day | ORAL | 0 refills | Status: DC

## 2022-10-22 ENCOUNTER — Telehealth: Payer: Self-pay

## 2022-10-22 DIAGNOSIS — E890 Postprocedural hypothyroidism: Secondary | ICD-10-CM

## 2022-10-22 NOTE — Telephone Encounter (Signed)
Orders placed   T4, free        Routine, Clinic Collect, Future, Expected: 10/23/2022, Expires: 10/22/2023     TSH        Routine, Clinic Collect, Future, Expected: 10/23/2022, Expires: 10/22/2023

## 2022-10-23 ENCOUNTER — Other Ambulatory Visit (INDEPENDENT_AMBULATORY_CARE_PROVIDER_SITE_OTHER): Payer: BC Managed Care – PPO

## 2022-10-23 DIAGNOSIS — E890 Postprocedural hypothyroidism: Secondary | ICD-10-CM | POA: Diagnosis not present

## 2022-10-23 LAB — T4, FREE: Free T4: 1.23 ng/dL (ref 0.60–1.60)

## 2022-10-23 LAB — TSH: TSH: 2.48 u[IU]/mL (ref 0.35–5.50)

## 2022-10-27 ENCOUNTER — Ambulatory Visit: Payer: BC Managed Care – PPO | Admitting: "Endocrinology

## 2022-10-27 ENCOUNTER — Encounter: Payer: Self-pay | Admitting: "Endocrinology

## 2022-10-27 VITALS — BP 134/80 | HR 92 | Ht 66.0 in | Wt 197.8 lb

## 2022-10-27 DIAGNOSIS — E890 Postprocedural hypothyroidism: Secondary | ICD-10-CM | POA: Diagnosis not present

## 2022-10-27 DIAGNOSIS — F17200 Nicotine dependence, unspecified, uncomplicated: Secondary | ICD-10-CM

## 2022-10-27 MED ORDER — LEVOTHYROXINE SODIUM 150 MCG PO TABS: 150.0000 ug | ORAL_TABLET | Freq: Every day | ORAL | 1 refills | Status: DC

## 2022-10-27 NOTE — Progress Notes (Signed)
Outpatient Endocrinology Note   [name] 05-21-1956 [id number]  Referring Provider: No ref. provider found Primary Care Provider: Pcp, No Subjective  Chief Complaint  Patient presents with   Follow-up    Assessment & Plan  Victoria Landry was seen today for follow-up.  Diagnoses and all orders for this visit:  Postablative hypothyroidism -     T4, free; Future -     TSH; Future  Smoker  Other orders -     levothyroxine (SYNTHROID) 150 MCG tablet; Take 1 tablet (150 mcg total) by mouth daily.    Victoria Landry is currently taking levothyroxine 150 mcg po qam. Patient currently clinically and biochemically euthyroid.  Educated on thyroid axis.  Recommend the following: Take levothyroxine every morning.  Advised to take levothyroxine first thing in the morning on empty stomach and wait at least 30 minutes to 1 hour before eating or drinking anything or taking any other medications. Space out levothyroxine by 4 hours from any acid reflux medication/fibrate/iron/calcium/multivitamin. Advised to take birth control pills and nutritional supplements in the evening. Repeat lab before next visit or sooner if symptoms of hyperthyroidism or hypothyroidism develop.  Notify us immediately in case of pregnancy/breastfeeding or significant weight gain or loss. Counseled on compliance and follow up needs  Current smoker Counseled association of Graves' disease with smoking Counseled smoking cessation  Patient understands  I spent more than 50% of today's visit counseling patient on symptoms, examination findings, lab findings, imaging results, treatment decisions and monitoring and prognosis. The patient understood the recommendations and agrees with the treatment plan. All questions regarding treatment plan were fully answered   Return in about 6 months (around 04/28/2023).   Altamese Olean, MD    I have reviewed current medications, nurse's notes, allergies, vital signs,  past medical and surgical history, family medical history, and social history for this encounter. Counseled patient on symptoms, examination findings, lab findings, imaging results, treatment decisions and monitoring and prognosis. The patient understood the recommendations and agrees with the treatment plan. All questions regarding treatment plan were fully answered.   History of Present Illness Victoria Landry is a 67 y.o. year old female who returns for f/u of post-RAI hypothyroidism (in 2003, she had RAI rx for hyperthyroidism due to Grave's Dz (due to error, RAI rx is not in epic); she has been on synthroid since then).  pt states she feels well in general.  She takes synthroid as rx'ed.   Grave's Ophthalmopathy Clinical Activity Score: 0/9  Symptoms suggestive of HYPOTHYROIDISM:  fatigue no weight gain no cold intolerance no constipation no  Symptoms suggestive of HYPERTHYROIDISM:  weight loss no heat intolerance no hyperdefecation no palpitations no  Compressive symptoms:  dysphagia no dysphonia no positional dyspnea (especially with simultaneous arms elevation) no  Smokes no On biotin no Personal history of head/neck surgery/irradiation yes  Physical Exam  BP 134/80 (BP Location: Right Arm, Patient Position: Sitting, Cuff Size: Normal)   Pulse 92   Ht 5\' 6"  (1.676 m)   Wt 197 lb 12.8 oz (89.7 kg)   SpO2 94%   BMI 31.93 kg/m  Constitutional: well developed, well nourished Head: normocephalic, atraumatic, no exophthalmos Eyes: sclera anicteric, no redness Neck: no thyromegaly, no thyroid tenderness; no nodules palpated Lungs: normal respiratory effort Neurology: alert and oriented, no tremor Skin: dry, no appreciable rashes Musculoskeletal: no appreciable defects Psychiatric: normal mood and affect  Allergies No Known Allergies  Current Medications Patient's Medications  New Prescriptions  No medications on file  Previous Medications   CALCIUM CARBONATE  (OS-CAL) 600 MG TABS    Take 600 mg by mouth daily.   LOSARTAN-HYDROCHLOROTHIAZIDE (HYZAAR) 50-12.5 MG TABLET    Take 0.5 tablets by mouth daily.   MULTIPLE VITAMIN (MULTIVITAMIN) TABLET    Take 1 tablet by mouth daily.  Modified Medications   Modified Medication Previous Medication   LEVOTHYROXINE (SYNTHROID) 150 MCG TABLET levothyroxine (SYNTHROID) 150 MCG tablet      Take 1 tablet (150 mcg total) by mouth daily.    Take 1 tablet (150 mcg total) by mouth daily.  Discontinued Medications   No medications on file    Past Medical History Past Medical History:  Diagnosis Date   ALLERGIC RHINITIS 02/02/2007   Hyperglycemia    HYPOTHYROIDISM, POST-RADIATION 03/01/2008   SMOKER 03/01/2008   VENTRICULAR HYPERTROPHY, LEFT 03/01/2008    Past Surgical History Past Surgical History:  Procedure Laterality Date   DOPPLER ECHOCARDIOGRAPHY  10/26/2003   ELECTROCARDIOGRAM  10/09/2005   I-131 therapy  07/22/2002    Family History family history includes Cancer in her father.  Social History Social History   Socioeconomic History   Marital status: Single    Spouse name: Not on file   Number of children: Not on file   Years of education: Not on file   Highest education level: Not on file  Occupational History   Occupation: Works Nurse, children's  Tobacco Use   Smoking status: Every Day   Smokeless tobacco: Never  Substance and Sexual Activity   Alcohol use: Yes   Drug use: Not on file   Sexual activity: Not on file  Other Topics Concern   Not on file  Social History Narrative   Not on file   Social Determinants of Health   Financial Resource Strain: Not on file  Food Insecurity: Not on file  Transportation Needs: Not on file  Physical Activity: Not on file  Stress: Not on file  Social Connections: Not on file  Intimate Partner Violence: Not on file    Laboratory Investigations Lab Results  Component Value Date   TSH 2.48 10/23/2022   FREET4 1.23 10/23/2022      Parts of this note  may have been dictated using voice recognition software. There may be variances in spelling and vocabulary which are unintentional. Not all errors are proofread. Please notify the There[facility] Parkin if any discrepancies are noted or if the meaning of any statement is not clear.

## 2023-04-16 ENCOUNTER — Other Ambulatory Visit (INDEPENDENT_AMBULATORY_CARE_PROVIDER_SITE_OTHER): Payer: BC Managed Care – PPO

## 2023-04-16 DIAGNOSIS — E890 Postprocedural hypothyroidism: Secondary | ICD-10-CM

## 2023-04-16 NOTE — Addendum Note (Signed)
Addended by: Gertie Baron D on: 04/16/2023 02:37 PM   Modules accepted: Orders

## 2023-04-17 LAB — T4, FREE: Free T4: 1.7 ng/dL (ref 0.8–1.8)

## 2023-04-17 LAB — TSH: TSH: 0.55 m[IU]/L (ref 0.40–4.50)

## 2023-04-21 ENCOUNTER — Encounter: Payer: Self-pay | Admitting: "Endocrinology

## 2023-04-21 ENCOUNTER — Ambulatory Visit (INDEPENDENT_AMBULATORY_CARE_PROVIDER_SITE_OTHER): Payer: BC Managed Care – PPO | Admitting: "Endocrinology

## 2023-04-21 VITALS — BP 140/90 | HR 94 | Ht 66.0 in | Wt 189.4 lb

## 2023-04-21 DIAGNOSIS — E890 Postprocedural hypothyroidism: Secondary | ICD-10-CM

## 2023-04-21 DIAGNOSIS — F17200 Nicotine dependence, unspecified, uncomplicated: Secondary | ICD-10-CM

## 2023-04-21 MED ORDER — LEVOTHYROXINE SODIUM 150 MCG PO TABS: 150.0000 ug | ORAL_TABLET | Freq: Every day | ORAL | 1 refills | Status: DC

## 2023-04-21 NOTE — Progress Notes (Signed)
Outpatient Endocrinology Note   Victoria Landry 08-20-1955 [id number]  Referring Provider: No ref. provider found Primary Care Provider: Pcp, No Subjective  No chief complaint on file.   Assessment & Plan  Diagnoses and all orders for this visit:  Postablative hypothyroidism -     T4, free; Future -     TSH; Future  Smoker  Other orders -     levothyroxine (SYNTHROID) 150 MCG tablet; Take 1 tablet (150 mcg total) by mouth daily.     Victoria Landry is currently taking levothyroxine 150 mcg po qam. Patient currently clinically and biochemically euthyroid.  Educated on thyroid axis.  Recommend the following: Take levothyroxine every morning.  Advised to take levothyroxine first thing in the morning on empty stomach and wait at least 30 minutes to 1 hour before eating or drinking anything or taking any other medications. Space out levothyroxine by 4 hours from any acid reflux medication/fibrate/iron/calcium/multivitamin. Advised to take birth control pills and nutritional supplements in the evening. Repeat lab before next visit or sooner if symptoms of hyperthyroidism or hypothyroidism develop.  Notify us immediately in case of pregnancy/breastfeeding or significant weight gain or loss. Counseled on compliance and follow up needs  Current smoker Counseled association of Graves' disease with smoking The patient was counseled on the dangers of tobacco use, and was advised to quit. Reviewed strategies to maximize success, including removing cigarettes and smoking materials from environment, stress management, support of family/friends, written materials and local smoking cessation programs. Encouraged to contact PCP for any help needed such as patches or pills to curb nicotine dependence . Patient understands and agreed   I spent more than 50% of today's visit counseling patient on symptoms, examination findings, lab findings, imaging results, treatment decisions and  monitoring and prognosis. The patient understood the recommendations and agrees with the treatment plan. All questions regarding treatment plan were fully answered   Return in about 4 months (around 08/22/2023) for visit + labs before next visit.   Altamese [place], MD    I have reviewed current medications, nurse's notes, allergies, vital signs, past medical and surgical history, family medical history, and social history for this encounter. Counseled patient on symptoms, examination findings, lab findings, imaging results, treatment decisions and monitoring and prognosis. The patient understood the recommendations and agrees with the treatment plan. All questions regarding treatment plan were fully answered.   History of Present Illness Victoria Landry is a 67 y.o. year old female who returns for f/u of post-RAI hypothyroidism (in 2003, she had RAI rx for hyperthyroidism due to Grave's Dz (due to error, RAI rx is not in epic); she has been on synthroid since then).  pt states she feels well in general.  She takes synthroid as rx'ed.   Grave's Ophthalmopathy Clinical Activity Score: 0/9  Symptoms suggestive of HYPOTHYROIDISM:  fatigue no weight gain no cold intolerance no constipation no  Symptoms suggestive of HYPERTHYROIDISM:  weight loss no heat intolerance no hyperdefecation no palpitations no  Compressive symptoms:  dysphagia no dysphonia no positional dyspnea (especially with simultaneous arms elevation) no  Smokes no On biotin no Personal history of head/neck surgery/irradiation yes  Physical Exam  BP (!) 140/90   Pulse 94   Ht 5\' 6"  (1.676 m)   Wt 189 lb 6.4 oz (85.9 kg)   SpO2 95%   BMI 30.57 kg/m  Constitutional: well developed, well nourished Head: normocephalic, atraumatic, no exophthalmos Eyes: sclera anicteric, no redness Neck: no  thyromegaly, no thyroid tenderness; no nodules palpated Lungs: normal respiratory effort Neurology: alert and oriented, no  tremor Skin: dry, no appreciable rashes Musculoskeletal: no appreciable defects Psychiatric: normal mood and affect  Allergies No Known Allergies  Current Medications Patient's Medications  New Prescriptions   No medications on file  Previous Medications   CALCIUM CARBONATE (OS-CAL) 600 MG TABS    Take 600 mg by mouth daily.   LOSARTAN-HYDROCHLOROTHIAZIDE (HYZAAR) 50-12.5 MG TABLET    Take 0.5 tablets by mouth daily.   MULTIPLE VITAMIN (MULTIVITAMIN) TABLET    Take 1 tablet by mouth daily.  Modified Medications   Modified Medication Previous Medication   LEVOTHYROXINE (SYNTHROID) 150 MCG TABLET levothyroxine (SYNTHROID) 150 MCG tablet      Take 1 tablet (150 mcg total) by mouth daily.    Take 1 tablet (150 mcg total) by mouth daily.  Discontinued Medications   No medications on file    Past Medical History Past Medical History:  Diagnosis Date   ALLERGIC RHINITIS 02/02/2007   Hyperglycemia    HYPOTHYROIDISM, POST-RADIATION 03/01/2008   SMOKER 03/01/2008   VENTRICULAR HYPERTROPHY, LEFT 03/01/2008    Past Surgical History Past Surgical History:  Procedure Laterality Date   DOPPLER ECHOCARDIOGRAPHY  10/26/2003   ELECTROCARDIOGRAM  10/09/2005   I-131 therapy  07/22/2002    Family History family history includes Cancer in her father.  Social History Social History   Socioeconomic History   Marital status: Single    Spouse name: Not on file   Number of children: Not on file   Years of education: Not on file   Highest education level: Not on file  Occupational History   Occupation: Works Nurse, children's  Tobacco Use   Smoking status: Every Day   Smokeless tobacco: Never  Substance and Sexual Activity   Alcohol use: Yes   Drug use: Not on file   Sexual activity: Not on file  Other Topics Concern   Not on file  Social History Narrative   Not on file   Social Determinants of Health   Financial Resource Strain: Not on file  Food Insecurity: Not on file  Transportation Needs:  Not on file  Physical Activity: Not on file  Stress: Not on file  Social Connections: Not on file  Intimate Partner Violence: Not on file    Laboratory Investigations Lab Results  Component Value Date   TSH 0.55 04/16/2023   FREET4 1.7 04/16/2023      Parts of this note may have been dictated using voice recognition software. There may be variances in spelling and vocabulary which are unintentional. Not all errors are proofread. Please notify the [facility] Parkin if any discrepancies are noted or if the meaning of any statement is not clear.

## 2023-04-23 ENCOUNTER — Other Ambulatory Visit: Payer: Self-pay | Admitting: "Endocrinology

## 2023-07-07 ENCOUNTER — Other Ambulatory Visit: Payer: Self-pay

## 2023-07-07 DIAGNOSIS — E890 Postprocedural hypothyroidism: Secondary | ICD-10-CM

## 2023-07-15 ENCOUNTER — Other Ambulatory Visit: Payer: HMO

## 2023-07-15 DIAGNOSIS — E890 Postprocedural hypothyroidism: Secondary | ICD-10-CM | POA: Diagnosis not present

## 2023-07-16 LAB — T4, FREE: Free T4: 1.6 ng/dL (ref 0.8–1.8)

## 2023-07-16 LAB — TSH: TSH: 1.96 m[IU]/L (ref 0.40–4.50)

## 2023-07-22 ENCOUNTER — Ambulatory Visit (INDEPENDENT_AMBULATORY_CARE_PROVIDER_SITE_OTHER): Payer: HMO | Admitting: "Endocrinology

## 2023-07-22 ENCOUNTER — Encounter: Payer: Self-pay | Admitting: "Endocrinology

## 2023-07-22 VITALS — BP 150/98 | HR 91 | Wt 193.8 lb

## 2023-07-22 DIAGNOSIS — F17200 Nicotine dependence, unspecified, uncomplicated: Secondary | ICD-10-CM | POA: Diagnosis not present

## 2023-07-22 DIAGNOSIS — E890 Postprocedural hypothyroidism: Secondary | ICD-10-CM | POA: Diagnosis not present

## 2023-07-22 MED ORDER — LEVOTHYROXINE SODIUM 150 MCG PO TABS: 150.0000 ug | ORAL_TABLET | Freq: Every day | ORAL | 1 refills | Status: DC

## 2023-07-22 NOTE — Progress Notes (Signed)
Outpatient Endocrinology Note   [name] 1955-12-24 [id number]  Referring Provider: No ref. provider found Primary Care Provider: Pcp, No Subjective  No chief complaint on file.   Assessment & Plan  Diagnoses and all orders for this visit:  Postablative hypothyroidism -     TSH(Reflex)  Smoker  Other orders -     levothyroxine (SYNTHROID) 150 MCG tablet; Take 1 tablet (150 mcg total) by mouth daily.   Victoria Landry is currently taking levothyroxine 150 mcg po qam. Patient currently clinically and biochemically euthyroid.  Educated on thyroid axis.  Recommend the following: Take levothyroxine every morning.  Advised to take levothyroxine first thing in the morning on empty stomach and wait at least 30 minutes to 1 hour before eating or drinking anything or taking any other medications. Space out levothyroxine by 4 hours from any acid reflux medication/fibrate/iron/calcium/multivitamin. Advised to take birth control pills and nutritional supplements in the evening. Repeat lab before next visit or sooner if symptoms of hyperthyroidism or hypothyroidism develop.  Notify us immediately in case of pregnancy/breastfeeding or significant weight gain or loss. Counseled on compliance and follow up needs  Current smoker Counseled association of Graves' disease with smoking The patient was counseled on the dangers of tobacco use, and was advised to quit. Reviewed strategies to maximize success. Provided handout. Encouraged to contact PCP for any help needed such as patches or pills to curb nicotine dependence . Patient understands and agreed   I spent more than 50% of today's visit counseling patient on symptoms, examination findings, lab findings, imaging results, treatment decisions and monitoring and prognosis. The patient understood the recommendations and agrees with the treatment plan. All questions regarding treatment plan were fully answered   Return in about  6 months (around 01/19/2024) for visit + labs before next visit.   [place]ia [place], MD    I have reviewed current medications, nurse's notes, allergies, vital signs, past medical and surgical history, family medical history, and social history for this encounter. Counseled patient on symptoms, examination findings, lab findings, imaging results, treatment decisions and monitoring and prognosis. The patient understood the recommendations and agrees with the treatment plan. All questions regarding treatment plan were fully answered.   History of Present Illness Victoria Landry is a 68 y.o. year old female who returns for f/u of post-RAI hypothyroidism (in 2003, she had RAI rx for hyperthyroidism due to Grave's Dz (due to error, RAI rx is not in epic); she has been on synthroid since then).  pt states she feels well in general.  She takes synthroid as rx'ed.   Grave's Ophthalmopathy Clinical Activity Score: 0/9  Symptoms suggestive of HYPOTHYROIDISM:  fatigue no weight gain no cold intolerance no constipation no  Symptoms suggestive of HYPERTHYROIDISM:  weight loss no heat intolerance no hyperdefecation no palpitations no  Compressive symptoms:  dysphagia no dysphonia no positional dyspnea (especially with simultaneous arms elevation) no  Smokes no On biotin no Personal history of head/neck surgery/irradiation yes  Physical Exam  BP (!) 150/98   Pulse 91   Wt 193 lb 12.8 oz (87.9 kg)   SpO2 99%   BMI 31.28 kg/m  Constitutional: well developed, well nourished Head: normocephalic, atraumatic, no exophthalmos Eyes: sclera anicteric, no redness Neck: no thyromegaly, no thyroid tenderness; no nodules palpated Lungs: normal respiratory effort Neurology: alert and oriented, no tremor Skin: dry, no appreciable rashes Musculoskeletal: no appreciable defects Psychiatric: normal mood and affect  Allergies No Known Allergies  Current Medications Patient's Medications  New  Prescriptions   No medications on file  Previous Medications   CALCIUM CARBONATE (OS-CAL) 600 MG TABS    Take 600 mg by mouth daily.   LOSARTAN-HYDROCHLOROTHIAZIDE (HYZAAR) 50-12.5 MG TABLET    Take 0.5 tablets by mouth daily.   MULTIPLE VITAMIN (MULTIVITAMIN) TABLET    Take 1 tablet by mouth daily.  Modified Medications   Modified Medication Previous Medication   LEVOTHYROXINE (SYNTHROID) 150 MCG TABLET levothyroxine (SYNTHROID) 150 MCG tablet      Take 1 tablet (150 mcg total) by mouth daily.    Take 1 tablet (150 mcg total) by mouth daily.  Discontinued Medications   No medications on file    Past Medical History Past Medical History:  Diagnosis Date   ALLERGIC RHINITIS 02/02/2007   Hyperglycemia    HYPOTHYROIDISM, POST-RADIATION 03/01/2008   SMOKER 03/01/2008   VENTRICULAR HYPERTROPHY, LEFT 03/01/2008    Past Surgical History Past Surgical History:  Procedure Laterality Date   DOPPLER ECHOCARDIOGRAPHY  10/26/2003   ELECTROCARDIOGRAM  10/09/2005   I-131 therapy  07/22/2002    Family History family history includes Cancer in her father.  Social History Social History   Socioeconomic History   Marital status: Single    Spouse name: Not on file   Number of children: Not on file   Years of education: Not on file   Highest education level: Not on file  Occupational History   Occupation: Works Nurse, children's  Tobacco Use   Smoking status: Every Day   Smokeless tobacco: Never  Substance and Sexual Activity   Alcohol use: Yes   Drug use: Not on file   Sexual activity: Not on file  Other Topics Concern   Not on file  Social History Narrative   Not on file   Social Drivers of Health   Financial Resource Strain: Not on file  Food Insecurity: Not on file  Transportation Needs: Not on file  Physical Activity: Not on file  Stress: Not on file  Social Connections: Not on file  Intimate Partner Violence: Not on file    Laboratory Investigations Lab Results  Component Value  Date   TSH 1.96 07/15/2023   FREET4 1.6 07/15/2023      Parts of this note may have been dictated using voice recognition software. There may be variances in spelling and vocabulary which are unintentional. Not all errors are proofread. Please notify the [facility] if any discrepancies are noted or if the meaning of any statement is not clear.

## 2024-01-17 ENCOUNTER — Other Ambulatory Visit: Payer: HMO

## 2024-01-20 ENCOUNTER — Ambulatory Visit: Payer: HMO | Admitting: "Endocrinology

## 2024-02-01 ENCOUNTER — Other Ambulatory Visit

## 2024-02-01 DIAGNOSIS — E890 Postprocedural hypothyroidism: Secondary | ICD-10-CM | POA: Diagnosis not present

## 2024-02-02 LAB — REFLEX TIQ

## 2024-02-02 LAB — TSH(REFL): TSH: 0.82 m[IU]/L (ref 0.40–4.50)

## 2024-02-08 ENCOUNTER — Ambulatory Visit: Admitting: "Endocrinology

## 2024-02-08 ENCOUNTER — Encounter: Payer: Self-pay | Admitting: "Endocrinology

## 2024-02-08 VITALS — BP 120/80 | HR 74 | Ht 66.0 in | Wt 193.0 lb

## 2024-02-08 DIAGNOSIS — E890 Postprocedural hypothyroidism: Secondary | ICD-10-CM | POA: Diagnosis not present

## 2024-02-08 DIAGNOSIS — F17200 Nicotine dependence, unspecified, uncomplicated: Secondary | ICD-10-CM | POA: Diagnosis not present

## 2024-02-08 MED ORDER — LEVOTHYROXINE SODIUM 150 MCG PO TABS: 150.0000 ug | ORAL_TABLET | Freq: Every day | ORAL | 1 refills | Status: AC

## 2024-02-08 NOTE — Progress Notes (Signed)
 Outpatient Endocrinology Note   Victoria Landry [birth year] [id number]  Referring Provider: No ref. provider found Primary Care Provider: Pcp, No Subjective  No chief complaint on file.   Assessment & Plan  Diagnoses and all orders for this visit:  Postablative hypothyroidism -     TSH + free T4  Smoker    Victoria Landry is currently taking levothyroxine  150 mcg po qam. Patient currently clinically and biochemically euthyroid.  Educated on thyroid  axis.  Recommend the following: Take levothyroxine  150mcg every morning.  Advised to take levothyroxine  first thing in the morning on empty stomach and wait at least 30 minutes to 1 hour before eating or drinking anything or taking any other medications. Space out levothyroxine  by 4 hours from any acid reflux medication/fibrate/iron/calcium/multivitamin. Advised to take birth control pills and nutritional supplements in the evening. Repeat lab before next visit or sooner if symptoms of hyperthyroidism or hypothyroidism develop.  Notify us  immediately in case of pregnancy/breastfeeding or significant weight gain or loss. Counseled on compliance and follow up needs  Current smoker Counseled association of Graves' disease with smoking The patient was counseled on the dangers of tobacco use, and was advised to quit. Reviewed strategies to maximize success. Provided handout. Encouraged to contact PCP for any help needed such as patches or pills to curb nicotine dependence . Patient understands and agreed   I spent more than 50% of today's visit counseling patient on symptoms, examination findings, lab findings, imaging results, treatment decisions and monitoring and prognosis. The patient understood the recommendations and agrees with the treatment plan. All questions regarding treatment plan were fully answered   Return in about 6 months (around 08/10/2024) for visit + labs before next visit.   Obadiah [place], MD    I have  reviewed current medications, nurse's notes, allergies, vital signs, past medical and surgical history, family medical history, and social history for this encounter. Counseled patient on symptoms, examination findings, lab findings, imaging results, treatment decisions and monitoring and prognosis. The patient understood the recommendations and agrees with the treatment plan. All questions regarding treatment plan were fully answered.   History of Present Illness Victoria Landry is a 68 y.o. year old female who returns for f/u of post-RAI hypothyroidism (in 2003, she had RAI rx for hyperthyroidism due to Grave's Dz (due to error, RAI rx is not in epic); she has been on synthroid  since then).  pt states she feels well in general.  She takes synthroid  as rx'ed.   Grave's Ophthalmopathy Clinical Activity Score: 0/9  Symptoms suggestive of HYPOTHYROIDISM:  fatigue no weight gain no cold intolerance no constipation no  Symptoms suggestive of HYPERTHYROIDISM:  weight loss no heat intolerance no hyperdefecation no palpitations no  Compressive symptoms:  dysphagia no dysphonia no positional dyspnea (especially with simultaneous arms elevation) no  Smokes no On biotin no Personal history of head/neck surgery/irradiation yes  Physical Exam  BP 120/80   Pulse 74   Ht 5' 6 (1.676 m)   Wt 193 lb (87.5 kg)   SpO2 96%   BMI 31.15 kg/m  Constitutional: well developed, well nourished Head: normocephalic, atraumatic, no exophthalmos Eyes: sclera anicteric, no redness Neck: no thyromegaly, no thyroid  tenderness; no nodules palpated Lungs: normal respiratory effort Neurology: alert and oriented, no tremor Skin: dry, no appreciable rashes Musculoskeletal: no appreciable defects Psychiatric: normal mood and affect  Allergies No Known Allergies  Current Medications Patient's Medications  New Prescriptions   No medications on file  Previous Medications   CALCIUM CARBONATE (OS-CAL) 600  MG TABS    Take 600 mg by mouth daily.   LEVOTHYROXINE  (SYNTHROID ) 150 MCG TABLET    Take 1 tablet (150 mcg total) by mouth daily.   LOSARTAN -HYDROCHLOROTHIAZIDE  (HYZAAR) 50-12.5 MG TABLET    Take 0.5 tablets by mouth daily.   MULTIPLE VITAMIN (MULTIVITAMIN) TABLET    Take 1 tablet by mouth daily.  Modified Medications   No medications on file  Discontinued Medications   No medications on file    Past Medical History Past Medical History:  Diagnosis Date   ALLERGIC RHINITIS 02/02/2007   Hyperglycemia    HYPOTHYROIDISM, POST-RADIATION 03/01/2008   SMOKER 03/01/2008   VENTRICULAR HYPERTROPHY, LEFT 03/01/2008    Past Surgical History Past Surgical History:  Procedure Laterality Date   DOPPLER ECHOCARDIOGRAPHY  10/26/2003   ELECTROCARDIOGRAM  10/09/2005   I-131 therapy  07/22/2002    Family History family history includes Cancer in her father.  Social History Social History   Socioeconomic History   Marital status: Single    Spouse name: Not on file   Number of children: Not on file   Years of education: Not on file   Highest education level: Not on file  Occupational History   Occupation: Works Nurse, children's  Tobacco Use   Smoking status: Every Day   Smokeless tobacco: Never  Substance and Sexual Activity   Alcohol use: Yes   Drug use: Not on file   Sexual activity: Not on file  Other Topics Concern   Not on file  Social History Narrative   Not on file   Social Drivers of Health   Financial Resource Strain: Not on file  Food Insecurity: Not on file  Transportation Needs: Not on file  Physical Activity: Not on file  Stress: Not on file  Social Connections: Not on file  Intimate Partner Violence: Not on file    Laboratory Investigations Lab Results  Component Value Date   TSH 1.96 07/15/2023   FREET4 1.6 07/15/2023      Parts of this note may have been dictated using voice recognition software. There may be variances in spelling and vocabulary which are  unintentional. Not all errors are proofread. Please notify the dino if any discrepancies are noted or if the meaning of any statement is not clear.

## 2024-08-04 ENCOUNTER — Other Ambulatory Visit

## 2024-08-10 ENCOUNTER — Ambulatory Visit: Admitting: "Endocrinology
